# Patient Record
Sex: Female | Born: 1950
Health system: Southern US, Community
[De-identification: ages and names within clinical notes are randomized; demographics above are authoritative.]

## PROBLEM LIST (undated history)

## (undated) DIAGNOSIS — F419 Anxiety disorder, unspecified: Secondary | ICD-10-CM

## (undated) DIAGNOSIS — Z9221 Personal history of antineoplastic chemotherapy: Secondary | ICD-10-CM

## (undated) DIAGNOSIS — Z923 Personal history of irradiation: Secondary | ICD-10-CM

## (undated) DIAGNOSIS — G20A1 Parkinson's disease without dyskinesia, without mention of fluctuations: Secondary | ICD-10-CM

## (undated) DIAGNOSIS — C50919 Malignant neoplasm of unspecified site of unspecified female breast: Secondary | ICD-10-CM

## (undated) DIAGNOSIS — I10 Essential (primary) hypertension: Secondary | ICD-10-CM

## (undated) DIAGNOSIS — G20 Parkinson's disease: Secondary | ICD-10-CM

## (undated) DIAGNOSIS — E785 Hyperlipidemia, unspecified: Secondary | ICD-10-CM

## (undated) DIAGNOSIS — E559 Vitamin D deficiency, unspecified: Secondary | ICD-10-CM

## (undated) DIAGNOSIS — G4700 Insomnia, unspecified: Secondary | ICD-10-CM

## (undated) HISTORY — DX: Essential (primary) hypertension: I10

## (undated) HISTORY — DX: Malignant neoplasm of unspecified site of unspecified female breast: C50.919

## (undated) HISTORY — DX: Insomnia, unspecified: G47.00

## (undated) HISTORY — DX: Anxiety disorder, unspecified: F41.9

## (undated) HISTORY — DX: Vitamin D deficiency, unspecified: E55.9

## (undated) HISTORY — DX: Hyperlipidemia, unspecified: E78.5

## (undated) HISTORY — DX: Parkinson's disease: G20

## (undated) HISTORY — PX: POLYPECTOMY: SHX149

## (undated) HISTORY — DX: Parkinson's disease without dyskinesia, without mention of fluctuations: G20.A1

---

## 1983-12-27 HISTORY — PX: DILATION AND CURETTAGE OF UTERUS: SHX78

## 1993-12-26 DIAGNOSIS — C50919 Malignant neoplasm of unspecified site of unspecified female breast: Secondary | ICD-10-CM

## 1993-12-26 DIAGNOSIS — Z923 Personal history of irradiation: Secondary | ICD-10-CM

## 1993-12-26 DIAGNOSIS — Z9221 Personal history of antineoplastic chemotherapy: Secondary | ICD-10-CM

## 1993-12-26 HISTORY — DX: Personal history of irradiation: Z92.3

## 1993-12-26 HISTORY — DX: Malignant neoplasm of unspecified site of unspecified female breast: C50.919

## 1993-12-26 HISTORY — PX: BREAST BIOPSY: SHX20

## 1993-12-26 HISTORY — DX: Personal history of antineoplastic chemotherapy: Z92.21

## 1993-12-26 HISTORY — PX: BREAST LUMPECTOMY: SHX2

## 2000-09-07 ENCOUNTER — Encounter: Payer: Self-pay | Admitting: General Surgery

## 2000-09-07 ENCOUNTER — Encounter: Admission: RE | Admit: 2000-09-07 | Discharge: 2000-09-07 | Payer: Self-pay | Admitting: General Surgery

## 2000-12-26 HISTORY — PX: REDUCTION MAMMAPLASTY: SUR839

## 2000-12-26 HISTORY — PX: BREAST RECONSTRUCTION: SHX9

## 2001-02-26 ENCOUNTER — Encounter (INDEPENDENT_AMBULATORY_CARE_PROVIDER_SITE_OTHER): Payer: Self-pay | Admitting: Specialist

## 2001-02-26 ENCOUNTER — Ambulatory Visit (HOSPITAL_BASED_OUTPATIENT_CLINIC_OR_DEPARTMENT_OTHER): Admission: RE | Admit: 2001-02-26 | Discharge: 2001-02-27 | Payer: Self-pay | Admitting: Specialist

## 2001-09-20 ENCOUNTER — Encounter: Payer: Self-pay | Admitting: General Surgery

## 2001-09-20 ENCOUNTER — Encounter: Admission: RE | Admit: 2001-09-20 | Discharge: 2001-09-20 | Payer: Self-pay | Admitting: General Surgery

## 2002-02-21 ENCOUNTER — Encounter: Admission: RE | Admit: 2002-02-21 | Discharge: 2002-02-21 | Payer: Self-pay | Admitting: General Surgery

## 2002-02-21 ENCOUNTER — Encounter: Payer: Self-pay | Admitting: General Surgery

## 2002-09-13 ENCOUNTER — Encounter: Payer: Self-pay | Admitting: General Surgery

## 2002-09-13 ENCOUNTER — Encounter: Admission: RE | Admit: 2002-09-13 | Discharge: 2002-09-13 | Payer: Self-pay | Admitting: General Surgery

## 2003-09-12 ENCOUNTER — Encounter: Admission: RE | Admit: 2003-09-12 | Discharge: 2003-09-12 | Payer: Self-pay | Admitting: General Surgery

## 2003-09-12 ENCOUNTER — Encounter: Payer: Self-pay | Admitting: General Surgery

## 2004-09-09 ENCOUNTER — Encounter: Admission: RE | Admit: 2004-09-09 | Discharge: 2004-09-09 | Payer: Self-pay | Admitting: General Surgery

## 2005-09-23 ENCOUNTER — Encounter: Admission: RE | Admit: 2005-09-23 | Discharge: 2005-09-23 | Payer: Self-pay | Admitting: General Surgery

## 2006-07-27 ENCOUNTER — Ambulatory Visit: Payer: Self-pay | Admitting: Gastroenterology

## 2006-08-10 ENCOUNTER — Ambulatory Visit: Payer: Self-pay | Admitting: Gastroenterology

## 2006-10-13 ENCOUNTER — Encounter: Admission: RE | Admit: 2006-10-13 | Discharge: 2006-10-13 | Payer: Self-pay | Admitting: General Surgery

## 2007-10-25 ENCOUNTER — Encounter: Admission: RE | Admit: 2007-10-25 | Discharge: 2007-10-25 | Payer: Self-pay | Admitting: Obstetrics and Gynecology

## 2010-02-26 ENCOUNTER — Encounter: Admission: RE | Admit: 2010-02-26 | Discharge: 2010-02-26 | Payer: Self-pay | Admitting: Obstetrics and Gynecology

## 2011-05-13 NOTE — Op Note (Signed)
[place]  Patient:    Christine Hayes, Christine Hayes                     MRN: [id number] Proc. Date: 02/26/01 Adm. Date:  [id number] Attending:  Gustavus Messing CC:         Yaakov Guthrie. Shon Hough, M.D. (2)   Operative Report  INDICATIONS:  This is a 60 year old lady who has severe macromastia on the right side.  She is status post a lumpectomy and radiation on the left side for breast cancer.  She has increased ptosis on that side.  PROCEDURE: 1. Right breast reduction. 2. Left mastopexy.  SURGEON:  Yaakov Guthrie. Shon Hough, M.D.  ASSISTANT:  Margaretha Sheffield, R.N.  ANESTHESIA:  General.  Preoperatively the patient is set up and drawn for the midline of the abdomen and chest, as well as the drawing from the suprasternal notch to each nipple/areolar complex, remarking each to 20.0 cm.  The mastopexy design was also drawn with the patient sitting up in a crescent diamond design.  The inferior pedicle technique reduction mammoplasty was also drawn out, remarking the nipple/areolar complex to 20.0 cm.  DESCRIPTION OF PROCEDURE:  She then underwent general anesthesia and was intubated orally.  A prep was done to the chest and breast areas in a routine fashion using Betadine soap and solution, and walled off with sterile towels and drapes, so as to make a sterile field.  Xylocaine 0.25% with epinephrine was injected locally for vasoconstriction, a total of 200 cc.  The wound was first scored with the #15 blade.  The left side was done first.  The skin was de-epithelialized over the crescent diamond configuration.  The lateral flaps were then freed up from the midline area, out approximately 10.0 cm.  After proper hemostasis, there was some increased bulging of the left lateral portion of the breast, and a limited reduction was done in that area of approximately 100.0 g.  After proper hemostasis, the flaps were then transposed and stayed with #2-0 Monocryl x 2  layers and then subcutaneous plain, and then running subcuticular stitches of #3-0 Monocryl throughout the mastopexy area.  The wounds were drained with a #10 Blake drain which was placed in the depths of the wound through the lower part of the incision edge. This was secured with #3-0 Prolene.  Next, attention was drawn to the right breast. The wounds were scored with a #15 blade, and the skin over the pedicle was de-epithelialized with a #20 blade.  The medial and lateral fatty dermal pedicles were excised down to underlying fascia.  Hemostasis was maintained with the Bovie unit on coagulation.  Out laterally more breast tissue was taken, as well as the new key hole area.  None was taken from the inferior pedicle itself.  After proper hemostasis, the flaps were transposed and stayed with #3-0 Prolene.  A subcutaneous closure was done with #3-0 Monocryl and #2-0 Monocryl x 2 layers, and then a running subcuticular stitch of #3-0 Monocryl and #5-0 Monocryl throughout the inverted T.  The wounds were drained with #10 Blake drain, which was placed in the depths of the wound, and brought out through a lateral portion of the incision, and secured with #3-0 Prolene.  Steri-Strips and a soft dressing were applied to all the areas.  She withstood the procedures very well, and was taken to the recovery room in excellent condition.DD:  02/26/01 TD:  02/26/01 Job: [id number]

## 2011-09-02 ENCOUNTER — Encounter: Payer: Self-pay | Admitting: Internal Medicine

## 2012-05-04 ENCOUNTER — Other Ambulatory Visit: Payer: Self-pay | Admitting: Obstetrics and Gynecology

## 2012-05-04 DIAGNOSIS — Z1231 Encounter for screening mammogram for malignant neoplasm of breast: Secondary | ICD-10-CM

## 2012-05-10 ENCOUNTER — Ambulatory Visit (AMBULATORY_SURGERY_CENTER): Payer: BC Managed Care – PPO | Admitting: *Deleted

## 2012-05-10 ENCOUNTER — Encounter: Payer: Self-pay | Admitting: Internal Medicine

## 2012-05-10 VITALS — Ht 71.0 in | Wt 210.1 lb

## 2012-05-10 DIAGNOSIS — Z1211 Encounter for screening for malignant neoplasm of colon: Secondary | ICD-10-CM

## 2012-05-10 MED ORDER — PEG-KCL-NACL-NASULF-NA ASC-C 100 G PO SOLR: ORAL | Status: DC

## 2012-05-18 ENCOUNTER — Telehealth: Payer: Self-pay | Admitting: Internal Medicine

## 2012-05-18 NOTE — Telephone Encounter (Signed)
Pt states she had a hard stool and noticed some blood on the toilet paper and on the stool. Has colon scheduled for Thursday and wanted to see if she could use some OTC hemorrhoid medication. Let pt know she could use preparation h otc or tucks wipes as needed. Pt verbalized understanding.

## 2012-05-24 ENCOUNTER — Encounter: Payer: Self-pay | Admitting: Internal Medicine

## 2012-05-24 ENCOUNTER — Ambulatory Visit (AMBULATORY_SURGERY_CENTER): Payer: BC Managed Care – PPO | Admitting: Internal Medicine

## 2012-05-24 VITALS — BP 134/80 | HR 97 | Temp 96.5°F | Resp 18 | Ht 71.0 in | Wt 210.0 lb

## 2012-05-24 DIAGNOSIS — D126 Benign neoplasm of colon, unspecified: Secondary | ICD-10-CM

## 2012-05-24 DIAGNOSIS — Z1211 Encounter for screening for malignant neoplasm of colon: Secondary | ICD-10-CM

## 2012-05-24 DIAGNOSIS — Z86010 Personal history of colonic polyps: Secondary | ICD-10-CM

## 2012-05-24 MED ORDER — SODIUM CHLORIDE 0.9 % IV SOLN: 500.0000 mL | INTRAVENOUS | Status: DC

## 2012-05-24 NOTE — Patient Instructions (Signed)
YOU HAD AN ENDOSCOPIC PROCEDURE TODAY AT THE [facility]: Refer to the procedure report that was given to you for any specific questions about what was found during the examination.  If the procedure report does not answer your questions, please call your gastroenterologist to clarify.  If you requested that your care partner not be given the details of your procedure findings, then the procedure report has been included in a sealed envelope for you to review at your convenience later.  YOU SHOULD EXPECT: Some feelings of bloating in the abdomen. Passage of more gas than usual.  Walking can help get rid of the air that was put into your GI tract during the procedure and reduce the bloating. If you had a lower endoscopy (such as a colonoscopy or flexible sigmoidoscopy) you may notice spotting of blood in your stool or on the toilet paper. If you underwent a bowel prep for your procedure, then you may not have a normal bowel movement for a few days.  DIET: Your first meal following the procedure should be a light meal and then it is ok to progress to your normal diet.  A half-sandwich or bowl of soup is an example of a good first meal.  Heavy or fried foods are harder to digest and may make you feel nauseous or bloated.  Likewise meals heavy in dairy and vegetables can cause extra gas to form and this can also increase the bloating.  Drink plenty of fluids but you should avoid alcoholic beverages for 24 hours.  ACTIVITY: Your care partner should take you home directly after the procedure.  You should plan to take it easy, moving slowly for the rest of the day.  You can resume normal activity the day after the procedure however you should NOT DRIVE or use heavy machinery for 24 hours (because of the sedation medicines used during the test).    SYMPTOMS TO REPORT IMMEDIATELY: A gastroenterologist can be reached at any hour.  During normal business hours, 8:30 AM to 5:00 PM Monday through Friday,  call [phone].  After hours and on weekends, please call the GI answering service at [phone] who will take a message and have the physician on call contact you.   Following lower endoscopy (colonoscopy or flexible sigmoidoscopy):  Excessive amounts of blood in the stool  Significant tenderness or worsening of abdominal pains  Swelling of the abdomen that is new, acute  Fever of 100F or higher   FOLLOW UP: If any biopsies were taken you will be contacted by phone or by letter within the next 1-3 weeks.  Call your gastroenterologist if you have not heard about the biopsies in 3 weeks.  Our staff will call the home number listed on your records the next business day following your procedure to check on you and address any questions or concerns that you may have at that time regarding the information given to you following your procedure. This is a courtesy call and so if there is no answer at the home number and we have not heard from you through the emergency physician on call, we will assume that you have returned to your regular daily activities without incident.  SIGNATURES/CONFIDENTIALITY: You and/or your care partner have signed paperwork which will be entered into your electronic medical record.  These signatures attest to the fact that that the information above on your After Visit Summary has been reviewed and is understood.  Full responsibility of the confidentiality of   this discharge information lies with you and/or your care-partner.   INFORMATION ON POLYPS GIVEN TO YOU TODAY 

## 2012-05-24 NOTE — Progress Notes (Signed)
Patient did not experience any of the following events: a burn prior to discharge; a fall within the facility; wrong site/side/patient/procedure/implant event; or a hospital transfer or hospital admission upon discharge from the facility. (G8907) Patient did not have preoperative order for IV antibiotic SSI prophylaxis. (G8918)  

## 2012-05-24 NOTE — Op Note (Signed)
[facility] [address] [place], [place]  [address]  COLONOSCOPY PROCEDURE REPORT  PATIENT:  Christine Hayes, Christine Hayes  MR#:  [id number] BIRTHDATE:  [birth year], 61 yrs. old  GENDER:  female ENDOSCOPIST:  Wilhemina Bonito. Eda Keys, MD REF. BY:  Surveillance Program Recall, PROCEDURE DATE:  05/24/2012 PROCEDURE:  Colonoscopy with snare polypectomy x 1 ASA CLASS:  Class II INDICATIONS:  history of pre-cancerous (adenomatous) colon polyps, surveillance and high-risk screening ; index 07-2004 w/ TAs; f/u 2007 MEDICATIONS:   MAC sedation, administered by CRNA, propofol (Diprivan) 380 mg IV  DESCRIPTION OF PROCEDURE:   After the risks benefits and alternatives of the procedure were thoroughly explained, informed consent was obtained.  Digital rectal exam was performed and revealed no abnormalities.   The LB CF-[id number] endoscope was introduced through the anus and advanced to the cecum, which was identified by both the appendix and ileocecal valve, without limitations.  The quality of the prep was excellent, using MoviPrep.  The instrument was then slowly withdrawn as the colon was fully examined. <<PROCEDUREIMAGES>>  FINDINGS:  A 7mm polyp was found in the sigmoid colon and snared without cautery. Retrieval was successful. Otherwise normal colonoscopy without other polyps, masses, vascular ectasias, or inflammatory changes.   Retroflexed views in the rectum revealed no abnormalities. The time to cecum =  5:29  minutes. The scope was then withdrawn in 13:33  minutes from the cecum and the procedure completed. COMPLICATIONS:  None  ENDOSCOPIC IMPRESSION: 1) Diminutive polyp in the sigmoid colon- removed 2) Otherwise normal colonoscopy  RECOMMENDATIONS: 1) Follow up colonoscopy in 5 years  ______________________________ Wilhemina Bonito. Eda Keys, MD  CC:  Wyn Forster MD [place], [place]);  The Patient  n. eSIGNED:   Salil Raineri N. Eda Keys at 05/24/2012 09:27 AM  Annye Rusk,  [phone]

## 2012-05-24 NOTE — Progress Notes (Addendum)
See scanned crna intra procedure report- propofol per [facility] crna. ewm  Pt tolerated procedure well. ewm

## 2012-05-25 ENCOUNTER — Telehealth: Payer: Self-pay | Admitting: *Deleted

## 2012-05-25 NOTE — Telephone Encounter (Signed)
  Follow up Call-  Call back number 05/24/2012  Post procedure Call Back phone  # [phone]  Permission to leave phone message Yes     Patient questions: No machine, unable to leave a message.  No answer

## 2012-05-30 ENCOUNTER — Encounter: Payer: Self-pay | Admitting: Internal Medicine

## 2012-05-31 ENCOUNTER — Ambulatory Visit
Admission: RE | Admit: 2012-05-31 | Discharge: 2012-05-31 | Disposition: A | Discharge status: Home / Self Care | Payer: BC Managed Care – PPO | Source: Ambulatory Visit | Attending: Obstetrics and Gynecology | Admitting: Obstetrics and Gynecology

## 2012-05-31 DIAGNOSIS — Z1231 Encounter for screening mammogram for malignant neoplasm of breast: Secondary | ICD-10-CM

## 2012-06-05 ENCOUNTER — Other Ambulatory Visit: Payer: Self-pay | Admitting: Obstetrics and Gynecology

## 2012-06-05 DIAGNOSIS — R928 Other abnormal and inconclusive findings on diagnostic imaging of breast: Secondary | ICD-10-CM

## 2012-06-07 ENCOUNTER — Ambulatory Visit
Admission: RE | Admit: 2012-06-07 | Discharge: 2012-06-07 | Disposition: A | Discharge status: Home / Self Care | Payer: BC Managed Care – PPO | Source: Ambulatory Visit | Attending: Obstetrics and Gynecology | Admitting: Obstetrics and Gynecology

## 2012-06-07 DIAGNOSIS — R928 Other abnormal and inconclusive findings on diagnostic imaging of breast: Secondary | ICD-10-CM

## 2012-11-23 ENCOUNTER — Other Ambulatory Visit: Payer: Self-pay | Admitting: Obstetrics and Gynecology

## 2012-11-23 DIAGNOSIS — N63 Unspecified lump in unspecified breast: Secondary | ICD-10-CM

## 2012-12-06 ENCOUNTER — Ambulatory Visit
Admission: RE | Admit: 2012-12-06 | Discharge: 2012-12-06 | Disposition: A | Discharge status: Home / Self Care | Payer: BC Managed Care – PPO | Source: Ambulatory Visit | Attending: Obstetrics and Gynecology | Admitting: Obstetrics and Gynecology

## 2012-12-06 DIAGNOSIS — N63 Unspecified lump in unspecified breast: Secondary | ICD-10-CM

## 2014-02-06 ENCOUNTER — Other Ambulatory Visit: Payer: Self-pay

## 2014-02-06 DIAGNOSIS — Z1231 Encounter for screening mammogram for malignant neoplasm of breast: Secondary | ICD-10-CM

## 2014-02-27 ENCOUNTER — Ambulatory Visit
Admission: RE | Admit: 2014-02-27 | Discharge: 2014-02-27 | Disposition: A | Discharge status: Home / Self Care | Payer: Self-pay | Source: Ambulatory Visit

## 2014-02-27 DIAGNOSIS — Z1231 Encounter for screening mammogram for malignant neoplasm of breast: Secondary | ICD-10-CM

## 2016-02-25 ENCOUNTER — Other Ambulatory Visit: Payer: Self-pay

## 2016-02-25 DIAGNOSIS — Z1231 Encounter for screening mammogram for malignant neoplasm of breast: Secondary | ICD-10-CM

## 2016-03-09 ENCOUNTER — Ambulatory Visit: Payer: Self-pay

## 2016-03-16 ENCOUNTER — Ambulatory Visit
Admission: RE | Admit: 2016-03-16 | Discharge: 2016-03-16 | Disposition: A | Discharge status: Home / Self Care | Payer: BLUE CROSS/BLUE SHIELD | Source: Ambulatory Visit

## 2016-03-16 DIAGNOSIS — Z1231 Encounter for screening mammogram for malignant neoplasm of breast: Secondary | ICD-10-CM

## 2017-03-22 ENCOUNTER — Encounter: Payer: Self-pay | Admitting: Internal Medicine

## 2017-04-20 ENCOUNTER — Other Ambulatory Visit: Payer: Self-pay | Admitting: Obstetrics and Gynecology

## 2017-04-20 DIAGNOSIS — Z1231 Encounter for screening mammogram for malignant neoplasm of breast: Secondary | ICD-10-CM

## 2017-04-26 ENCOUNTER — Encounter: Payer: Self-pay | Admitting: Internal Medicine

## 2017-05-15 ENCOUNTER — Other Ambulatory Visit: Payer: Self-pay | Admitting: Family Medicine

## 2017-05-15 ENCOUNTER — Ambulatory Visit
Admission: RE | Admit: 2017-05-15 | Discharge: 2017-05-15 | Disposition: A | Discharge status: Home / Self Care | Payer: BLUE CROSS/BLUE SHIELD | Source: Ambulatory Visit | Attending: Obstetrics and Gynecology | Admitting: Obstetrics and Gynecology

## 2017-05-15 DIAGNOSIS — Z1231 Encounter for screening mammogram for malignant neoplasm of breast: Secondary | ICD-10-CM

## 2017-05-15 HISTORY — DX: Personal history of antineoplastic chemotherapy: Z92.21

## 2017-05-15 HISTORY — DX: Personal history of irradiation: Z92.3

## 2017-06-26 ENCOUNTER — Ambulatory Visit (AMBULATORY_SURGERY_CENTER): Payer: Self-pay | Admitting: *Deleted

## 2017-06-26 VITALS — Ht 71.0 in | Wt 218.0 lb

## 2017-06-26 DIAGNOSIS — Z86010 Personal history of colonic polyps: Secondary | ICD-10-CM

## 2017-06-26 DIAGNOSIS — N649 Disorder of breast, unspecified: Secondary | ICD-10-CM | POA: Insufficient documentation

## 2017-06-26 DIAGNOSIS — F419 Anxiety disorder, unspecified: Secondary | ICD-10-CM | POA: Insufficient documentation

## 2017-06-26 DIAGNOSIS — C801 Malignant (primary) neoplasm, unspecified: Secondary | ICD-10-CM | POA: Insufficient documentation

## 2017-06-26 DIAGNOSIS — IMO0002 Reserved for concepts with insufficient information to code with codable children: Secondary | ICD-10-CM | POA: Insufficient documentation

## 2017-06-26 DIAGNOSIS — E756 Lipid storage disorder, unspecified: Secondary | ICD-10-CM | POA: Insufficient documentation

## 2017-06-26 MED ORDER — NA SULFATE-K SULFATE-MG SULF 17.5-3.13-1.6 GM/177ML PO SOLN: ORAL | 0 refills | Status: DC

## 2017-06-26 NOTE — Progress Notes (Signed)
Patient denies any allergies to eggs or soy. Patient denies any problems with anesthesia/sedation. Patient denies any oxygen use at home. Patient takes a diet/weight loss patch, she is aware to stop this 10 days prior to colonoscopy. EMMI education assisgned to patient on colonoscopy, this was explained and instructions given to patient.

## 2017-07-12 ENCOUNTER — Encounter: Payer: Self-pay | Admitting: Internal Medicine

## 2017-07-12 ENCOUNTER — Ambulatory Visit (AMBULATORY_SURGERY_CENTER): Payer: BLUE CROSS/BLUE SHIELD | Admitting: Internal Medicine

## 2017-07-12 VITALS — BP 158/84 | HR 71 | Temp 97.7°F | Resp 23 | Ht 71.0 in | Wt 218.0 lb

## 2017-07-12 DIAGNOSIS — D125 Benign neoplasm of sigmoid colon: Secondary | ICD-10-CM

## 2017-07-12 DIAGNOSIS — D123 Benign neoplasm of transverse colon: Secondary | ICD-10-CM

## 2017-07-12 DIAGNOSIS — D122 Benign neoplasm of ascending colon: Secondary | ICD-10-CM | POA: Diagnosis not present

## 2017-07-12 DIAGNOSIS — Z86010 Personal history of colonic polyps: Secondary | ICD-10-CM | POA: Diagnosis not present

## 2017-07-12 HISTORY — PX: COLONOSCOPY: SHX174

## 2017-07-12 MED ORDER — SODIUM CHLORIDE 0.9 % IV SOLN: 500.0000 mL | INTRAVENOUS | Status: AC

## 2017-07-12 NOTE — Progress Notes (Signed)
Called to room to assist during endoscopic procedure.  Patient ID and intended procedure confirmed with present staff. Received instructions for my participation in the procedure from the performing physician.  

## 2017-07-12 NOTE — Progress Notes (Signed)
Spontaneous respirations throughout. VSS. Resting comfortably. To PACU on room air. Report to  Annette RN.  

## 2017-07-12 NOTE — Op Note (Signed)
[facility] Patient Name: Christine Hayes Procedure Date: 07/12/2017 10:30 AM MRN: [id number] Endoscopist: Docia Chuck. Henrene Pastor , MD Age: 66 Referring MD:  Date of Birth: Jun 01, 1951 Gender: Female Account #: [id number] Procedure:                Colonoscopy with cold snare polypectomy x 5 Indications:              High risk colon cancer surveillance: Personal                            history of multiple (3 or more) adenomas Medicines:                Monitored Anesthesia Care Procedure:                Pre-Anesthesia Assessment:                           - Prior to the procedure, a History and Physical                            was performed, and patient medications and                            allergies were reviewed. The patient's tolerance of                            previous anesthesia was also reviewed. The risks                            and benefits of the procedure and the sedation                            options and risks were discussed with the patient.                            All questions were answered, and informed consent                            was obtained. Prior Anticoagulants: The patient has                            taken no previous anticoagulant or antiplatelet                            agents. ASA Grade Assessment: II - A patient with                            mild systemic disease. After reviewing the risks                            and benefits, the patient was deemed in                            satisfactory condition to undergo the procedure.  After obtaining informed consent, the colonoscope                            was passed under direct vision. Throughout the                            procedure, the patient's blood pressure, pulse, and                            oxygen saturations were monitored continuously. The                            Colonoscope was introduced through the anus and          advanced to the the cecum, identified by                            appendiceal orifice and ileocecal valve. The                            ileocecal valve, appendiceal orifice, and rectum                            were photographed. The quality of the bowel                            preparation was excellent. The colonoscopy was                            performed without difficulty. The patient tolerated                            the procedure well. The bowel preparation used was                            SUPREP. Scope In: 10:41:21 AM Scope Out: 11:02:26 AM Scope Withdrawal Time: 0 hours 16 minutes 46 seconds  Total Procedure Duration: 0 hours 21 minutes 5 seconds  Findings:                 Five polyps were found in the sigmoid colon,                            transverse colon, hepatic flexure and ascending                            colon. The polyps were 2 to 4 mm in size. These                            polyps were removed with a cold snare. Resection                            and retrieval were complete.  Internal hemorrhoids were found during retroflexion.                           The exam was otherwise without abnormality on                            direct and retroflexion views. Complications:            No immediate complications. Estimated blood loss:                            None. Estimated Blood Loss:     Estimated blood loss: none. Impression:               - Five 2 to 4 mm polyps in the sigmoid colon, in                            the transverse colon, at the hepatic flexure and in                            the ascending colon, removed with a cold snare.                            Resected and retrieved.                           - Internal hemorrhoids.                           - The examination was otherwise normal on direct                            and retroflexion views. Recommendation:           - Repeat colonoscopy in  3 - 5 years for                            surveillance.                           - Patient has a contact number available for                            emergencies. The signs and symptoms of potential                            delayed complications were discussed with the                            patient. Return to normal activities tomorrow.                            Written discharge instructions were provided to the                            patient.                           -  Resume previous diet.                           - Continue present medications.                           - Await pathology results. Docia Chuck. Henrene Pastor, MD 07/12/2017 11:11:01 AM This report has been signed electronically.

## 2017-07-12 NOTE — Progress Notes (Signed)
No problems noted in the recovery room. maw 

## 2017-07-12 NOTE — Patient Instructions (Signed)
YOU HAD AN ENDOSCOPIC PROCEDURE TODAY AT THE [facility]:   Refer to the procedure report that was given to you for any specific questions about what was found during the examination.  If the procedure report does not answer your questions, please call your gastroenterologist to clarify.  If you requested that your care partner not be given the details of your procedure findings, then the procedure report has been included in a sealed envelope for you to review at your convenience later.  YOU SHOULD EXPECT: Some feelings of bloating in the abdomen. Passage of more gas than usual.  Walking can help get rid of the air that was put into your GI tract during the procedure and reduce the bloating. If you had a lower endoscopy (such as a colonoscopy or flexible sigmoidoscopy) you may notice spotting of blood in your stool or on the toilet paper. If you underwent a bowel prep for your procedure, you may not have a normal bowel movement for a few days.  Please Note:  You might notice some irritation and congestion in your nose or some drainage.  This is from the oxygen used during your procedure.  There is no need for concern and it should clear up in a day or so.  SYMPTOMS TO REPORT IMMEDIATELY:   Following lower endoscopy (colonoscopy or flexible sigmoidoscopy):  Excessive amounts of blood in the stool  Significant tenderness or worsening of abdominal pains  Swelling of the abdomen that is new, acute  Fever of 100F or higher   For urgent or emergent issues, a gastroenterologist can be reached at any hour by calling [phone].   DIET:  We do recommend a small meal at first, but then you may proceed to your regular diet.  Drink plenty of fluids but you should avoid alcoholic beverages for 24 hours.  ACTIVITY:  You should plan to take it easy for the rest of today and you should NOT DRIVE or use heavy machinery until tomorrow (because of the sedation medicines used during the test).     FOLLOW UP: Our staff will call the number listed on your records the next business day following your procedure to check on you and address any questions or concerns that you may have regarding the information given to you following your procedure. If we do not reach you, we will leave a message.  However, if you are feeling well and you are not experiencing any problems, there is no need to return our call.  We will assume that you have returned to your regular daily activities without incident.  If any biopsies were taken you will be contacted by phone or by letter within the next 1-3 weeks.  Please call us at [phone] if you have not heard about the biopsies in 3 weeks.    SIGNATURES/CONFIDENTIALITY: You and/or your care partner have signed paperwork which will be entered into your electronic medical record.  These signatures attest to the fact that that the information above on your After Visit Summary has been reviewed and is understood.  Full responsibility of the confidentiality of this discharge information lies with you and/or your care-partner.    Handouts were given to your care partner on polyps and hemorrhoids. You may resume your current medications today. Await biopsy results. Please call if any questions or concerns.   

## 2017-07-13 ENCOUNTER — Telehealth: Payer: Self-pay

## 2017-07-13 NOTE — Telephone Encounter (Signed)
  Follow up Call-  Call back number 07/12/2017  Post procedure Call Back phone  # [phone]  Permission to leave phone message Yes  Some recent data might be hidden     Patient questions:  Do you have a fever, pain , or abdominal swelling? No. Pain Score  0 *  Have you tolerated food without any problems? Yes.    Have you been able to return to your normal activities? Yes.    Do you have any questions about your discharge instructions: Diet   No. Medications  No. Follow up visit  No.  Do you have questions or concerns about your Care? No.  Actions: * If pain score is 4 or above: No action needed, pain <4.  No problems noted per pt. maw

## 2017-07-18 ENCOUNTER — Encounter: Payer: Self-pay | Admitting: Internal Medicine

## 2017-07-19 ENCOUNTER — Encounter: Payer: Self-pay | Admitting: *Deleted

## 2017-08-21 ENCOUNTER — Encounter (INDEPENDENT_AMBULATORY_CARE_PROVIDER_SITE_OTHER): Payer: Self-pay

## 2017-08-21 ENCOUNTER — Encounter: Payer: Self-pay | Admitting: Podiatry

## 2017-08-21 ENCOUNTER — Ambulatory Visit (INDEPENDENT_AMBULATORY_CARE_PROVIDER_SITE_OTHER): Payer: BLUE CROSS/BLUE SHIELD | Admitting: Podiatry

## 2017-08-21 VITALS — BP 141/80 | HR 79

## 2017-08-21 DIAGNOSIS — M79675 Pain in left toe(s): Secondary | ICD-10-CM | POA: Diagnosis not present

## 2017-08-21 DIAGNOSIS — L601 Onycholysis: Secondary | ICD-10-CM | POA: Diagnosis not present

## 2017-08-21 NOTE — Patient Instructions (Signed)

## 2017-08-21 NOTE — Progress Notes (Signed)
   Subjective:    Patient ID: Christine Hayes, female    DOB: September 09, 1951, 66 y.o.   MRN: [id number]  HPI  I noticed some pressure with my shoes on my left great toe, I get regular pedicures.  Than last night it was really bothering me and I had my husband look and he thought the nail was coming off so I called today.     Review of Systems  All other systems reviewed and are negative.      Objective:   Physical Exam        Assessment & Plan:

## 2017-08-21 NOTE — Progress Notes (Signed)
  Subjective:  Patient ID: Christine Hayes, female    DOB: 1951/11/03,  MRN: [id number] HPI Chief Complaint  Patient presents with  . Nail Problem    left big toe nail is broken and lifting    67 y.o. female presents with the above complaint. Endorses frequent pedicures about every 3 weeks. Reports she went hiking this weekend and felt pain in her toe but thought it was just from her shoes. States when she took her sock off she noticed the nail appeared "broken." Reports pain in the toe. Denies other pedal issues. Has never had this issue before.  Past Medical History:  Diagnosis Date  . Anxiety   . Breast cancer ([place]) 1995  . Hyperlipidemia   . Hypertension   . Personal history of chemotherapy 1995  . Personal history of radiation therapy 1995   Past Surgical History:  Procedure Laterality Date  . BREAST BIOPSY Left 1995   malignant  . BREAST LUMPECTOMY Left 1995   left  . BREAST RECONSTRUCTION  2002   left and breast reduction right breast  . DILATION AND CURETTAGE OF UTERUS  1985  . REDUCTION MAMMAPLASTY Bilateral 2002    Current Outpatient Prescriptions:  .  aspirin EC 81 MG tablet, Take 81 mg by mouth daily., Disp: , Rfl:  .  LORazepam (ATIVAN) 1 MG tablet, Take 1 mg by mouth at bedtime. , Disp: , Rfl:  .  losartan (COZAAR) 50 MG tablet, Take 50 mg by mouth daily., Disp: , Rfl:  .  PRESCRIPTION MEDICATION, 1 patch daily. Weight loss control patch (fat burning & appetite suppressing), Disp: , Rfl:  .  simvastatin (ZOCOR) 40 MG tablet, Take 40 mg by mouth daily., Disp: , Rfl:   Current Facility-Administered Medications:  .  0.9 %  sodium chloride infusion, 500 mL, Intravenous, Continuous, Irene Shipper, MD  No Known Allergies Review of Systems Objective:   Vitals:   08/21/17 0849  BP: (!) 141/80  Pulse: 79   General AA&O x3. Normal mood and affect.  Vascular Dorsalis pedis and posterior tibial pulses  present 2+ bilaterally  Capillary refill normal to all  digits. Pedal hair growth normal.  Neurologic Epicritic sensation present bilaterally.  Dermatologic No open lesions. Interspaces clear of maceration.  Normal skin temperature and turgor. L hallux nail with >50% lysis distally. Nail yellowish in appearance. L 4th toe nevus with regular borders and color.  Orthopedic: MMT 5/5 in dorsiflexion, plantarflexion, inversion, and eversion. Normal lower extremity joint ROM without pain or crepitus.    Assessment & Plan:  Patient was evaluated and treated and all questions answered.  Nail Lysis, L Hallux -Patient elects to proceed with ingrown toenail removal today. -Total nail avulsion L hallux. See procedure note. -Educated on post-procedure care including soaking. Written instructions provided. -Patient to follow up in 2 weeks for nail check.  Procedure: Excision of Ingrown Toenail Location: Left 1st toe  Anesthesia: Lidocaine 1% plain; 1.53mL and Marcaine 0.5% plain; 1.67mL, digital block. Skin Prep: Betadine. Dressing: antibiotic ointment; telfa; dry, sterile, compression dressing. Technique: Following skin prep, the toe was exsanguinated and a tourniquet was secured at the base of the toe. The nail was freed and avulsed with a freer and hemostat. The tourniquet was then removed, good capillary refill was observed, and sterile dressing applied. Disposition: Patient tolerated procedure well. Patient to return in 2 weeks for follow-up.  Return in about 2 weeks (around 09/04/2017).

## 2017-09-04 ENCOUNTER — Ambulatory Visit: Payer: BLUE CROSS/BLUE SHIELD | Admitting: Podiatry

## 2017-09-11 ENCOUNTER — Ambulatory Visit: Payer: BLUE CROSS/BLUE SHIELD | Admitting: Podiatry

## 2018-02-26 DIAGNOSIS — L821 Other seborrheic keratosis: Secondary | ICD-10-CM | POA: Diagnosis not present

## 2018-03-07 DIAGNOSIS — Z23 Encounter for immunization: Secondary | ICD-10-CM | POA: Diagnosis not present

## 2018-08-13 ENCOUNTER — Other Ambulatory Visit: Payer: Self-pay | Admitting: Family Medicine

## 2018-08-13 DIAGNOSIS — Z1231 Encounter for screening mammogram for malignant neoplasm of breast: Secondary | ICD-10-CM

## 2018-08-22 DIAGNOSIS — E785 Hyperlipidemia, unspecified: Secondary | ICD-10-CM | POA: Diagnosis not present

## 2018-08-22 DIAGNOSIS — E559 Vitamin D deficiency, unspecified: Secondary | ICD-10-CM | POA: Diagnosis not present

## 2018-08-22 DIAGNOSIS — Z1331 Encounter for screening for depression: Secondary | ICD-10-CM | POA: Diagnosis not present

## 2018-08-22 DIAGNOSIS — I10 Essential (primary) hypertension: Secondary | ICD-10-CM | POA: Diagnosis not present

## 2018-08-22 DIAGNOSIS — Z0000 Encounter for general adult medical examination without abnormal findings: Secondary | ICD-10-CM | POA: Diagnosis not present

## 2018-08-22 DIAGNOSIS — E669 Obesity, unspecified: Secondary | ICD-10-CM | POA: Diagnosis not present

## 2018-08-22 DIAGNOSIS — Z79899 Other long term (current) drug therapy: Secondary | ICD-10-CM | POA: Diagnosis not present

## 2018-08-22 DIAGNOSIS — Z9181 History of falling: Secondary | ICD-10-CM | POA: Diagnosis not present

## 2018-08-22 DIAGNOSIS — R739 Hyperglycemia, unspecified: Secondary | ICD-10-CM | POA: Diagnosis not present

## 2018-09-05 ENCOUNTER — Ambulatory Visit
Admission: RE | Admit: 2018-09-05 | Discharge: 2018-09-05 | Disposition: A | Discharge status: Home / Self Care | Payer: Medicare Other | Source: Ambulatory Visit | Attending: Family Medicine | Admitting: Family Medicine

## 2018-09-05 DIAGNOSIS — Z1231 Encounter for screening mammogram for malignant neoplasm of breast: Secondary | ICD-10-CM | POA: Diagnosis not present

## 2019-03-11 DIAGNOSIS — Z23 Encounter for immunization: Secondary | ICD-10-CM | POA: Diagnosis not present

## 2019-09-26 DIAGNOSIS — M1712 Unilateral primary osteoarthritis, left knee: Secondary | ICD-10-CM | POA: Diagnosis not present

## 2019-10-08 ENCOUNTER — Ambulatory Visit
Admission: RE | Admit: 2019-10-08 | Discharge: 2019-10-08 | Disposition: A | Discharge status: Home / Self Care | Payer: Medicare Other | Source: Ambulatory Visit | Attending: Family Medicine | Admitting: Family Medicine

## 2019-10-08 ENCOUNTER — Other Ambulatory Visit: Payer: Self-pay | Admitting: Family Medicine

## 2019-10-08 ENCOUNTER — Other Ambulatory Visit: Payer: Self-pay

## 2019-10-08 DIAGNOSIS — Z1231 Encounter for screening mammogram for malignant neoplasm of breast: Secondary | ICD-10-CM

## 2019-11-18 DIAGNOSIS — E785 Hyperlipidemia, unspecified: Secondary | ICD-10-CM | POA: Diagnosis not present

## 2019-11-18 DIAGNOSIS — I10 Essential (primary) hypertension: Secondary | ICD-10-CM | POA: Diagnosis not present

## 2019-11-18 DIAGNOSIS — Z1331 Encounter for screening for depression: Secondary | ICD-10-CM | POA: Diagnosis not present

## 2019-11-18 DIAGNOSIS — Z1231 Encounter for screening mammogram for malignant neoplasm of breast: Secondary | ICD-10-CM | POA: Diagnosis not present

## 2019-11-18 DIAGNOSIS — Z9181 History of falling: Secondary | ICD-10-CM | POA: Diagnosis not present

## 2020-01-04 DIAGNOSIS — M25551 Pain in right hip: Secondary | ICD-10-CM | POA: Diagnosis not present

## 2020-01-10 DIAGNOSIS — M25551 Pain in right hip: Secondary | ICD-10-CM | POA: Diagnosis not present

## 2020-01-10 DIAGNOSIS — M898X5 Other specified disorders of bone, thigh: Secondary | ICD-10-CM | POA: Diagnosis not present

## 2020-01-15 DIAGNOSIS — M25551 Pain in right hip: Secondary | ICD-10-CM | POA: Diagnosis not present

## 2020-02-26 DIAGNOSIS — E559 Vitamin D deficiency, unspecified: Secondary | ICD-10-CM | POA: Diagnosis not present

## 2020-02-26 DIAGNOSIS — E785 Hyperlipidemia, unspecified: Secondary | ICD-10-CM | POA: Diagnosis not present

## 2020-02-26 DIAGNOSIS — R739 Hyperglycemia, unspecified: Secondary | ICD-10-CM | POA: Diagnosis not present

## 2020-04-29 DIAGNOSIS — M25562 Pain in left knee: Secondary | ICD-10-CM | POA: Diagnosis not present

## 2020-04-29 DIAGNOSIS — M1712 Unilateral primary osteoarthritis, left knee: Secondary | ICD-10-CM | POA: Diagnosis not present

## 2020-05-04 ENCOUNTER — Other Ambulatory Visit: Payer: Self-pay

## 2020-05-04 ENCOUNTER — Ambulatory Visit: Payer: BLUE CROSS/BLUE SHIELD | Admitting: Podiatry

## 2020-05-06 DIAGNOSIS — M1712 Unilateral primary osteoarthritis, left knee: Secondary | ICD-10-CM | POA: Diagnosis not present

## 2020-05-06 DIAGNOSIS — M25562 Pain in left knee: Secondary | ICD-10-CM | POA: Diagnosis not present

## 2020-05-06 DIAGNOSIS — M25561 Pain in right knee: Secondary | ICD-10-CM | POA: Diagnosis not present

## 2020-05-11 ENCOUNTER — Ambulatory Visit (INDEPENDENT_AMBULATORY_CARE_PROVIDER_SITE_OTHER): Payer: Medicare Other | Admitting: Podiatry

## 2020-05-11 ENCOUNTER — Other Ambulatory Visit: Payer: Self-pay

## 2020-05-11 ENCOUNTER — Encounter: Payer: Self-pay | Admitting: Podiatry

## 2020-05-11 DIAGNOSIS — M19079 Primary osteoarthritis, unspecified ankle and foot: Secondary | ICD-10-CM | POA: Diagnosis not present

## 2020-05-11 DIAGNOSIS — M779 Enthesopathy, unspecified: Secondary | ICD-10-CM | POA: Diagnosis not present

## 2020-05-11 NOTE — Progress Notes (Signed)
  Subjective:  Patient ID: Christine Hayes, female    DOB: Sep 07, 1951,  MRN: [id number]  Chief Complaint  Patient presents with  . Foot Problem    my left foot was hurting as well as the right but i went and saw orthpedic about my left knee and started taken ibuprofen    69 y.o. female presents with the above complaint. History confirmed with patient.   Objective:  Physical Exam: warm, good capillary refill, no trophic changes or ulcerative lesions, normal DP and PT pulses and normal sensory exam. Bilat Foot: mild POP dorsal foot bilat with prominent midfoot exostosis    Assessment:   1. Capsulitis   2. Arthritis, midfoot     Plan:  Patient was evaluated and treated and all questions answered.  Arthritis -Educated on etiology  -Defer injection today. -Discussed not taking Ibu for extended period.  No follow-ups on file.

## 2020-05-11 NOTE — Addendum Note (Signed)
Addended by: Cranford Mon R on: 05/11/2020 11:17 AM   Modules accepted: Orders

## 2020-05-13 DIAGNOSIS — M1712 Unilateral primary osteoarthritis, left knee: Secondary | ICD-10-CM | POA: Diagnosis not present

## 2020-05-13 DIAGNOSIS — M25562 Pain in left knee: Secondary | ICD-10-CM | POA: Diagnosis not present

## 2020-05-19 ENCOUNTER — Ambulatory Visit (INDEPENDENT_AMBULATORY_CARE_PROVIDER_SITE_OTHER): Payer: Medicare Other | Admitting: Podiatry

## 2020-05-19 DIAGNOSIS — Z5329 Procedure and treatment not carried out because of patient's decision for other reasons: Secondary | ICD-10-CM

## 2020-05-19 NOTE — Progress Notes (Signed)
Same day cxl / no show.

## 2020-05-20 DIAGNOSIS — M1712 Unilateral primary osteoarthritis, left knee: Secondary | ICD-10-CM | POA: Diagnosis not present

## 2020-05-20 DIAGNOSIS — M25562 Pain in left knee: Secondary | ICD-10-CM | POA: Diagnosis not present

## 2020-05-29 DIAGNOSIS — M25562 Pain in left knee: Secondary | ICD-10-CM | POA: Diagnosis not present

## 2020-05-29 DIAGNOSIS — M1712 Unilateral primary osteoarthritis, left knee: Secondary | ICD-10-CM | POA: Diagnosis not present

## 2020-06-15 ENCOUNTER — Other Ambulatory Visit: Payer: Self-pay

## 2020-06-15 ENCOUNTER — Ambulatory Visit (INDEPENDENT_AMBULATORY_CARE_PROVIDER_SITE_OTHER): Payer: Medicare Other | Admitting: Podiatry

## 2020-06-15 DIAGNOSIS — M19071 Primary osteoarthritis, right ankle and foot: Secondary | ICD-10-CM | POA: Diagnosis not present

## 2020-06-15 DIAGNOSIS — M19072 Primary osteoarthritis, left ankle and foot: Secondary | ICD-10-CM

## 2020-06-15 DIAGNOSIS — M19079 Primary osteoarthritis, unspecified ankle and foot: Secondary | ICD-10-CM

## 2020-06-15 MED ORDER — MELOXICAM 7.5 MG PO TABS
7.5000 mg | ORAL_TABLET | Freq: Every day | ORAL | 0 refills | Status: DC
Start: 2020-06-15 — End: 2020-10-19

## 2020-06-15 NOTE — Progress Notes (Signed)
  Subjective:  Patient ID: Christine Hayes, female    DOB: [birth year],  MRN: [id number]  Chief Complaint  Patient presents with  . Foot Pain    F/U for injections Pt.s tates," still having a lot of pain, pain sometimes worse than others; Lt>Rt; 6/10 shapr pains.' - w/ burning -pt denies swellgin tx: IBU     69 y.o. female presents with the above complaint. History confirmed with patient.   Objective:  Physical Exam: warm, good capillary refill, no trophic changes or ulcerative lesions, normal DP and PT pulses and normal sensory exam. Dorsal midfoot exostosis with POP bilat, left more prominent.  Assessment:   1. Arthritis of midfoot    Plan:  Patient was evaluated and treated and all questions answered.  Arthritis -Injection delivered to the painful joint -Rx Mobic  Procedure: Joint Injection Location: Bilateral 2nd TMT joint Skin Prep: Alcohol. Injectate: 0.5 cc 1% lidocaine plain, 0.5 cc dexamethasone phosphate. Disposition: Patient tolerated procedure well. Injection site dressed with a band-aid.   Return in about 1 month (around 07/15/2020) for Arthritis.

## 2020-07-10 DIAGNOSIS — I10 Essential (primary) hypertension: Secondary | ICD-10-CM | POA: Diagnosis not present

## 2020-07-10 DIAGNOSIS — E785 Hyperlipidemia, unspecified: Secondary | ICD-10-CM | POA: Diagnosis not present

## 2020-07-10 DIAGNOSIS — Z6833 Body mass index (BMI) 33.0-33.9, adult: Secondary | ICD-10-CM | POA: Diagnosis not present

## 2020-07-10 DIAGNOSIS — R748 Abnormal levels of other serum enzymes: Secondary | ICD-10-CM | POA: Diagnosis not present

## 2020-07-10 DIAGNOSIS — R739 Hyperglycemia, unspecified: Secondary | ICD-10-CM | POA: Diagnosis not present

## 2020-07-10 DIAGNOSIS — R197 Diarrhea, unspecified: Secondary | ICD-10-CM | POA: Diagnosis not present

## 2020-07-13 DIAGNOSIS — R197 Diarrhea, unspecified: Secondary | ICD-10-CM | POA: Diagnosis not present

## 2020-07-20 ENCOUNTER — Encounter: Payer: Self-pay | Admitting: Internal Medicine

## 2020-07-20 ENCOUNTER — Other Ambulatory Visit: Payer: Self-pay

## 2020-07-20 ENCOUNTER — Ambulatory Visit (INDEPENDENT_AMBULATORY_CARE_PROVIDER_SITE_OTHER): Payer: Medicare Other | Admitting: Podiatry

## 2020-07-20 DIAGNOSIS — M19079 Primary osteoarthritis, unspecified ankle and foot: Secondary | ICD-10-CM

## 2020-07-20 DIAGNOSIS — M19072 Primary osteoarthritis, left ankle and foot: Secondary | ICD-10-CM | POA: Diagnosis not present

## 2020-07-20 NOTE — Progress Notes (Signed)
  Subjective:  Patient ID: Christine Hayes, female    DOB: 1951-03-08,  MRN: [id number]  Chief Complaint  Patient presents with  . Arthritis    F/U BL arthritis Pt. states," Rt ons is good, Lt one is better like if I sit for a long time and get up it bothers me; 4/10 occasional pain." tx: none -n oswelling     69 y.o. female presents with the above complaint. History confirmed with patient.   Objective:  Physical Exam: warm, good capillary refill, no trophic changes or ulcerative lesions, normal DP and PT pulses and normal sensory exam. Dorsal midfoot exostosis with POP left, no pain right  Assessment:   1. Arthritis of midfoot    Plan:  Patient was evaluated and treated and all questions answered.  Arthritis -Repeat injection left  Procedure: Joint Injection Location: Left 2nd TMT joint Skin Prep: Alcohol. Injectate: 0.5 cc 1% lidocaine plain, 0.5 cc dexamethasone phosphate. Disposition: Patient tolerated procedure well. Injection site dressed with a band-aid.      No follow-ups on file.

## 2020-07-28 DIAGNOSIS — Z20822 Contact with and (suspected) exposure to covid-19: Secondary | ICD-10-CM | POA: Diagnosis not present

## 2020-09-03 ENCOUNTER — Ambulatory Visit: Payer: Medicare Other | Admitting: Podiatry

## 2020-09-03 DIAGNOSIS — M25562 Pain in left knee: Secondary | ICD-10-CM | POA: Diagnosis not present

## 2020-09-03 DIAGNOSIS — M1712 Unilateral primary osteoarthritis, left knee: Secondary | ICD-10-CM | POA: Diagnosis not present

## 2020-09-04 ENCOUNTER — Ambulatory Visit (AMBULATORY_SURGERY_CENTER): Payer: Self-pay | Admitting: *Deleted

## 2020-09-04 ENCOUNTER — Other Ambulatory Visit: Payer: Self-pay

## 2020-09-04 VITALS — Ht 71.0 in | Wt 224.0 lb

## 2020-09-04 DIAGNOSIS — Z86010 Personal history of colonic polyps: Secondary | ICD-10-CM

## 2020-09-04 MED ORDER — SUTAB 1479-225-188 MG PO TABS: 1.0000 | ORAL_TABLET | ORAL | 0 refills | Status: DC

## 2020-09-04 NOTE — Progress Notes (Signed)
Patient is here in-person for PV. Patient denies any allergies to eggs or soy. Patient denies any problems with anesthesia/sedation. Patient denies any oxygen use at home. Patient denies taking any diet/weight loss medications or blood thinners. Patient is not being treated for MRSA or C-diff. Patient is aware of our care-partner policy and [id number] safety protocol.   COVID-19 vaccines completed on 01/2020, per patient.   Prep Prescription coupon given to the patient.

## 2020-09-17 ENCOUNTER — Ambulatory Visit (AMBULATORY_SURGERY_CENTER): Payer: Medicare Other | Admitting: Internal Medicine

## 2020-09-17 ENCOUNTER — Other Ambulatory Visit: Payer: Self-pay

## 2020-09-17 ENCOUNTER — Encounter: Payer: Self-pay | Admitting: Internal Medicine

## 2020-09-17 VITALS — BP 131/83 | HR 86 | Temp 96.9°F | Resp 13 | Ht 71.0 in | Wt 224.0 lb

## 2020-09-17 DIAGNOSIS — Z86010 Personal history of colonic polyps: Secondary | ICD-10-CM | POA: Diagnosis not present

## 2020-09-17 DIAGNOSIS — D123 Benign neoplasm of transverse colon: Secondary | ICD-10-CM

## 2020-09-17 DIAGNOSIS — I10 Essential (primary) hypertension: Secondary | ICD-10-CM | POA: Diagnosis not present

## 2020-09-17 DIAGNOSIS — E785 Hyperlipidemia, unspecified: Secondary | ICD-10-CM | POA: Diagnosis not present

## 2020-09-17 MED ORDER — SODIUM CHLORIDE 0.9 % IV SOLN: 500.0000 mL | Freq: Once | INTRAVENOUS | Status: DC

## 2020-09-17 NOTE — Progress Notes (Signed)
Called to room to assist during endoscopic procedure.  Patient ID and intended procedure confirmed with present staff. Received instructions for my participation in the procedure from the performing physician.  

## 2020-09-17 NOTE — Progress Notes (Signed)
VS-  Christell Constant RN  Pt's states no medical or surgical changes since previsit or office visit.

## 2020-09-17 NOTE — Progress Notes (Signed)
Report to PACU, RN, vss, BBS= Clear.  

## 2020-09-17 NOTE — Patient Instructions (Signed)
° °Handouts on polyps & hemorrhoids given to you today  ° °Await pathology results on polyps removed  ° ° ° °YOU HAD AN ENDOSCOPIC PROCEDURE TODAY AT THE [facility]:   Refer to the procedure report that was given to you for any specific questions about what was found during the examination.  If the procedure report does not answer your questions, please call your gastroenterologist to clarify.  If you requested that your care partner not be given the details of your procedure findings, then the procedure report has been included in a sealed envelope for you to review at your convenience later. ° °YOU SHOULD EXPECT: Some feelings of bloating in the abdomen. Passage of more gas than usual.  Walking can help get rid of the air that was put into your GI tract during the procedure and reduce the bloating. If you had a lower endoscopy (such as a colonoscopy or flexible sigmoidoscopy) you may notice spotting of blood in your stool or on the toilet paper. If you underwent a bowel prep for your procedure, you may not have a normal bowel movement for a few days. ° °Please Note:  You might notice some irritation and congestion in your nose or some drainage.  This is from the oxygen used during your procedure.  There is no need for concern and it should clear up in a day or so. ° °SYMPTOMS TO REPORT IMMEDIATELY: ° °Following lower endoscopy (colonoscopy or flexible sigmoidoscopy): ° Excessive amounts of blood in the stool ° Significant tenderness or worsening of abdominal pains ° Swelling of the abdomen that is new, acute ° Fever of 100°F or higher ° ° °For urgent or emergent issues, a gastroenterologist can be reached at any hour by calling [phone]. °Do not use MyChart messaging for urgent concerns.  ° ° °DIET:  We do recommend a small meal at first, but then you may proceed to your regular diet.  Drink plenty of fluids but you should avoid alcoholic beverages for 24 hours. ° °ACTIVITY:  You should plan  to take it easy for the rest of today and you should NOT DRIVE or use heavy machinery until tomorrow (because of the sedation medicines used during the test).   ° °FOLLOW UP: °Our staff will call the number listed on your records 48-72 hours following your procedure to check on you and address any questions or concerns that you may have regarding the information given to you following your procedure. If we do not reach you, we will leave a message.  We will attempt to reach you two times.  During this call, we will ask if you have developed any symptoms of COVID 19. If you develop any symptoms (ie: fever, flu-like symptoms, shortness of breath, cough etc.) before then, please call [phone].  If you test positive for Covid 19 in the 2 weeks post procedure, please call and report this information to us.   ° °If any biopsies were taken you will be contacted by phone or by letter within the next 1-3 weeks.  Please call us at [phone] if you have not heard about the biopsies in 3 weeks.  ° ° °SIGNATURES/CONFIDENTIALITY: °You and/or your care partner have signed paperwork which will be entered into your electronic medical record.  These signatures attest to the fact that that the information above on your After Visit Summary has been reviewed and is understood.  Full responsibility of the confidentiality of this discharge information lies with you   your care-partner.

## 2020-09-17 NOTE — Op Note (Signed)
Christine Hayes Patient Name: Christine Hayes Procedure Date: 09/17/2020 10:51 AM MRN: [id number] Endoscopist: Docia Chuck. Henrene Pastor , MD Age: 69 Referring MD:  Date of Birth: 14-May-1951 Gender: Female Account #: [id number] Procedure:                Colonoscopy with cold snare polypectomy x 2 Indications:              High risk colon cancer surveillance: Personal                            history of multiple (3 or more) adenomas. Previous                            examinations 2005, 2007, 2013, 2018 Medicines:                Monitored Anesthesia Care Procedure:                Pre-Anesthesia Assessment:                           - Prior to the procedure, a History and Physical                            was performed, and patient medications and                            allergies were reviewed. The patient's tolerance of                            previous anesthesia was also reviewed. The risks                            and benefits of the procedure and the sedation                            options and risks were discussed with the patient.                            All questions were answered, and informed consent                            was obtained. Prior Anticoagulants: The patient has                            taken no previous anticoagulant or antiplatelet                            agents. ASA Grade Assessment: II - A patient with                            mild systemic disease. After reviewing the risks                            and benefits, the patient was deemed in  satisfactory condition to undergo the procedure.                           After obtaining informed consent, the colonoscope                            was passed under direct vision. Throughout the                            procedure, the patient's blood pressure, pulse, and                            oxygen saturations were monitored continuously. The                             Colonoscope was introduced through the anus and                            advanced to the the cecum, identified by                            appendiceal orifice and ileocecal valve. The                            ileocecal valve, appendiceal orifice, and rectum                            were photographed. The quality of the bowel                            preparation was excellent. The colonoscopy was                            performed without difficulty. The patient tolerated                            the procedure well. The bowel preparation used was                            SUPREP via split dose instruction. Scope In: 10:58:59 AM Scope Out: 11:15:57 AM Scope Withdrawal Time: 0 hours 12 minutes 3 seconds  Total Procedure Duration: 0 hours 16 minutes 58 seconds  Findings:                 Two polyps were found in the transverse colon. The                            polyps were 2 mm in size. These polyps were removed                            with a cold snare. Resection and retrieval were                            complete.  Internal hemorrhoids were found during                            retroflexion. The hemorrhoids were small.                           The exam was otherwise without abnormality on                            direct and retroflexion views. Complications:            No immediate complications. Estimated blood loss:                            None. Estimated Blood Loss:     Estimated blood loss: none. Impression:               - Two 2 mm polyps in the transverse colon, removed                            with a cold snare. Resected and retrieved.                           - Internal hemorrhoids.                           - The examination was otherwise normal on direct                            and retroflexion views. Recommendation:           - Repeat colonoscopy in 5 years for surveillance.                           - Patient  has a contact number available for                            emergencies. The signs and symptoms of potential                            delayed complications were discussed with the                            patient. Return to normal activities tomorrow.                            Written discharge instructions were provided to the                            patient.                           - Resume previous diet.                           - Continue present medications.                           -  Await pathology results. Docia Chuck. Henrene Pastor, MD 09/17/2020 11:21:41 AM This report has been signed electronically.

## 2020-09-21 ENCOUNTER — Other Ambulatory Visit: Payer: Self-pay | Admitting: Nurse Practitioner

## 2020-09-21 ENCOUNTER — Telehealth: Payer: Self-pay | Admitting: *Deleted

## 2020-09-21 DIAGNOSIS — Z1231 Encounter for screening mammogram for malignant neoplasm of breast: Secondary | ICD-10-CM

## 2020-09-21 NOTE — Telephone Encounter (Signed)
  Follow up Call-  Call back number 09/17/2020  Post procedure Call Back phone  # [phone]  Permission to leave phone message Yes  Some recent data might be hidden     Patient questions:  Do you have a fever, pain , or abdominal swelling? No. Pain Score  0 *  Have you tolerated food without any problems? Yes.    Have you been able to return to your normal activities? Yes.    Do you have any questions about your discharge instructions: Diet   No. Medications  No. Follow up visit  No.  Do you have questions or concerns about your Care? No.  Actions: * If pain score is 4 or above: No action needed, pain <4  1. Have you developed a fever since your procedure? NO  2.   Have you had an respiratory symptoms (SOB or cough) since your procedure? NO  3.   Have you tested positive for COVID 19 since your procedure NO  4.   Have you had any family members/close contacts diagnosed with the COVID 19 since your procedure?  NO   If yes to any of these questions please route to Joylene John, RN and Joella Prince, RN

## 2020-09-22 ENCOUNTER — Encounter: Payer: Self-pay | Admitting: Internal Medicine

## 2020-10-08 DIAGNOSIS — M1712 Unilateral primary osteoarthritis, left knee: Secondary | ICD-10-CM | POA: Diagnosis not present

## 2020-10-08 DIAGNOSIS — M25562 Pain in left knee: Secondary | ICD-10-CM | POA: Diagnosis not present

## 2020-10-19 ENCOUNTER — Encounter: Payer: Self-pay | Admitting: Podiatry

## 2020-10-19 ENCOUNTER — Ambulatory Visit (INDEPENDENT_AMBULATORY_CARE_PROVIDER_SITE_OTHER): Payer: Medicare Other | Admitting: Podiatry

## 2020-10-19 ENCOUNTER — Other Ambulatory Visit: Payer: Self-pay

## 2020-10-19 DIAGNOSIS — M19079 Primary osteoarthritis, unspecified ankle and foot: Secondary | ICD-10-CM

## 2020-10-19 DIAGNOSIS — M19072 Primary osteoarthritis, left ankle and foot: Secondary | ICD-10-CM | POA: Diagnosis not present

## 2020-10-19 MED ORDER — MELOXICAM 7.5 MG PO TABS
7.5000 mg | ORAL_TABLET | Freq: Every day | ORAL | 1 refills | Status: DC
Start: 2020-10-19 — End: 2020-11-30

## 2020-10-19 NOTE — Progress Notes (Signed)
  Subjective:  Patient ID: Christine Hayes, female    DOB: Oct 12, 1951,  MRN: [id number]  Chief Complaint  Patient presents with  . Foot Problem    the left foot is bothering me again and the pain comes and goes    69 y.o. female presents with the above complaint. History confirmed with patient.   Objective:  Physical Exam: warm, good capillary refill, no trophic changes or ulcerative lesions, normal DP and PT pulses and normal sensory exam. Dorsal midfoot exostosis with POP left, no pain right  Assessment:   1. Arthritis of midfoot    Plan:  Patient was evaluated and treated and all questions answered.  Arthritis -Final repeat injection as below -Refill Meloxicam  Procedure: Joint Injection Location: Left 2nd TMT joint Skin Prep: Alcohol. Injectate: 0.5 cc 1% lidocaine plain, 0.5 cc dexamethasone phosphate. Disposition: Patient tolerated procedure well. Injection site dressed with a band-aid.        No follow-ups on file.

## 2020-10-21 DIAGNOSIS — M1712 Unilateral primary osteoarthritis, left knee: Secondary | ICD-10-CM | POA: Diagnosis not present

## 2020-10-21 DIAGNOSIS — M25562 Pain in left knee: Secondary | ICD-10-CM | POA: Diagnosis not present

## 2020-10-22 ENCOUNTER — Other Ambulatory Visit: Payer: Self-pay

## 2020-10-22 ENCOUNTER — Ambulatory Visit
Admission: RE | Admit: 2020-10-22 | Discharge: 2020-10-22 | Disposition: A | Discharge status: Home / Self Care | Payer: Medicare Other | Source: Ambulatory Visit | Attending: Nurse Practitioner | Admitting: Nurse Practitioner

## 2020-10-22 DIAGNOSIS — Z1231 Encounter for screening mammogram for malignant neoplasm of breast: Secondary | ICD-10-CM | POA: Diagnosis not present

## 2020-10-28 DIAGNOSIS — M25562 Pain in left knee: Secondary | ICD-10-CM | POA: Diagnosis not present

## 2020-10-28 DIAGNOSIS — M1712 Unilateral primary osteoarthritis, left knee: Secondary | ICD-10-CM | POA: Diagnosis not present

## 2020-11-30 ENCOUNTER — Encounter: Payer: Self-pay | Admitting: Podiatry

## 2020-11-30 ENCOUNTER — Other Ambulatory Visit: Payer: Self-pay

## 2020-11-30 ENCOUNTER — Ambulatory Visit (INDEPENDENT_AMBULATORY_CARE_PROVIDER_SITE_OTHER): Payer: Medicare Other | Admitting: Podiatry

## 2020-11-30 DIAGNOSIS — M19079 Primary osteoarthritis, unspecified ankle and foot: Secondary | ICD-10-CM | POA: Diagnosis not present

## 2020-11-30 DIAGNOSIS — M779 Enthesopathy, unspecified: Secondary | ICD-10-CM | POA: Diagnosis not present

## 2020-11-30 MED ORDER — MELOXICAM 7.5 MG PO TABS
7.5000 mg | ORAL_TABLET | Freq: Every day | ORAL | 2 refills | Status: DC
Start: 2020-11-30 — End: 2021-07-12

## 2020-11-30 NOTE — Progress Notes (Signed)
  Subjective:  Patient ID: UVA RUNKEL, female    DOB: 06-Jul-1951,  MRN: [id number]  No chief complaint on file.  69 y.o. female presents with the above complaint. History confirmed with patient. States the pain is doing much better, taking meloxicam without side effects.  Objective:  Physical Exam: warm, good capillary refill, no trophic changes or ulcerative lesions, normal DP and PT pulses and normal sensory exam. Dorsal midfoot exostosis without POP  Assessment:   1. Arthritis of midfoot   2. Capsulitis    Plan:  Patient was evaluated and treated and all questions answered.  Arthritis -No injection today. Discussed should pain persist would consider surgical intervention. Reviewed details of surgical procedure with patient. Hold off surgical intervention. -Refill Meloxicam.   No follow-ups on file.

## 2020-12-02 DIAGNOSIS — L259 Unspecified contact dermatitis, unspecified cause: Secondary | ICD-10-CM | POA: Diagnosis not present

## 2020-12-02 DIAGNOSIS — Q828 Other specified congenital malformations of skin: Secondary | ICD-10-CM | POA: Diagnosis not present

## 2020-12-02 DIAGNOSIS — D485 Neoplasm of uncertain behavior of skin: Secondary | ICD-10-CM | POA: Diagnosis not present

## 2020-12-08 DIAGNOSIS — N762 Acute vulvitis: Secondary | ICD-10-CM | POA: Diagnosis not present

## 2020-12-30 DIAGNOSIS — Z1152 Encounter for screening for COVID-19: Secondary | ICD-10-CM | POA: Diagnosis not present

## 2021-02-16 DIAGNOSIS — B079 Viral wart, unspecified: Secondary | ICD-10-CM | POA: Diagnosis not present

## 2021-02-16 DIAGNOSIS — L821 Other seborrheic keratosis: Secondary | ICD-10-CM | POA: Diagnosis not present

## 2021-02-16 DIAGNOSIS — L814 Other melanin hyperpigmentation: Secondary | ICD-10-CM | POA: Diagnosis not present

## 2021-02-16 DIAGNOSIS — L309 Dermatitis, unspecified: Secondary | ICD-10-CM | POA: Diagnosis not present

## 2021-02-16 DIAGNOSIS — D229 Melanocytic nevi, unspecified: Secondary | ICD-10-CM | POA: Diagnosis not present

## 2021-02-16 DIAGNOSIS — D485 Neoplasm of uncertain behavior of skin: Secondary | ICD-10-CM | POA: Diagnosis not present

## 2021-02-16 DIAGNOSIS — L819 Disorder of pigmentation, unspecified: Secondary | ICD-10-CM | POA: Diagnosis not present

## 2021-02-17 DIAGNOSIS — M1712 Unilateral primary osteoarthritis, left knee: Secondary | ICD-10-CM | POA: Diagnosis not present

## 2021-02-17 DIAGNOSIS — M170 Bilateral primary osteoarthritis of knee: Secondary | ICD-10-CM | POA: Diagnosis not present

## 2021-02-17 DIAGNOSIS — M25362 Other instability, left knee: Secondary | ICD-10-CM | POA: Diagnosis not present

## 2021-02-17 DIAGNOSIS — M25562 Pain in left knee: Secondary | ICD-10-CM | POA: Diagnosis not present

## 2021-02-17 DIAGNOSIS — M21162 Varus deformity, not elsewhere classified, left knee: Secondary | ICD-10-CM | POA: Diagnosis not present

## 2021-02-24 DIAGNOSIS — M25562 Pain in left knee: Secondary | ICD-10-CM | POA: Diagnosis not present

## 2021-02-24 DIAGNOSIS — M1712 Unilateral primary osteoarthritis, left knee: Secondary | ICD-10-CM | POA: Diagnosis not present

## 2021-03-03 DIAGNOSIS — M1712 Unilateral primary osteoarthritis, left knee: Secondary | ICD-10-CM | POA: Diagnosis not present

## 2021-03-03 DIAGNOSIS — M25562 Pain in left knee: Secondary | ICD-10-CM | POA: Diagnosis not present

## 2021-03-10 DIAGNOSIS — M1712 Unilateral primary osteoarthritis, left knee: Secondary | ICD-10-CM | POA: Diagnosis not present

## 2021-03-10 DIAGNOSIS — M25562 Pain in left knee: Secondary | ICD-10-CM | POA: Diagnosis not present

## 2021-03-15 DIAGNOSIS — Z1331 Encounter for screening for depression: Secondary | ICD-10-CM | POA: Diagnosis not present

## 2021-03-15 DIAGNOSIS — Z6832 Body mass index (BMI) 32.0-32.9, adult: Secondary | ICD-10-CM | POA: Diagnosis not present

## 2021-03-15 DIAGNOSIS — Z9181 History of falling: Secondary | ICD-10-CM | POA: Diagnosis not present

## 2021-03-15 DIAGNOSIS — I10 Essential (primary) hypertension: Secondary | ICD-10-CM | POA: Diagnosis not present

## 2021-03-15 DIAGNOSIS — Z139 Encounter for screening, unspecified: Secondary | ICD-10-CM | POA: Diagnosis not present

## 2021-03-15 DIAGNOSIS — E785 Hyperlipidemia, unspecified: Secondary | ICD-10-CM | POA: Diagnosis not present

## 2021-03-15 DIAGNOSIS — R739 Hyperglycemia, unspecified: Secondary | ICD-10-CM | POA: Diagnosis not present

## 2021-03-17 DIAGNOSIS — M25562 Pain in left knee: Secondary | ICD-10-CM | POA: Diagnosis not present

## 2021-03-17 DIAGNOSIS — M1712 Unilateral primary osteoarthritis, left knee: Secondary | ICD-10-CM | POA: Diagnosis not present

## 2021-05-07 DIAGNOSIS — Z23 Encounter for immunization: Secondary | ICD-10-CM | POA: Diagnosis not present

## 2021-06-15 DIAGNOSIS — I10 Essential (primary) hypertension: Secondary | ICD-10-CM | POA: Diagnosis not present

## 2021-06-15 DIAGNOSIS — R251 Tremor, unspecified: Secondary | ICD-10-CM | POA: Diagnosis not present

## 2021-06-15 DIAGNOSIS — Z6831 Body mass index (BMI) 31.0-31.9, adult: Secondary | ICD-10-CM | POA: Diagnosis not present

## 2021-06-15 DIAGNOSIS — G4700 Insomnia, unspecified: Secondary | ICD-10-CM | POA: Diagnosis not present

## 2021-06-15 DIAGNOSIS — E785 Hyperlipidemia, unspecified: Secondary | ICD-10-CM | POA: Diagnosis not present

## 2021-06-15 DIAGNOSIS — Z853 Personal history of malignant neoplasm of breast: Secondary | ICD-10-CM | POA: Diagnosis not present

## 2021-06-23 DIAGNOSIS — M1712 Unilateral primary osteoarthritis, left knee: Secondary | ICD-10-CM | POA: Diagnosis not present

## 2021-06-23 DIAGNOSIS — M25562 Pain in left knee: Secondary | ICD-10-CM | POA: Diagnosis not present

## 2021-06-24 ENCOUNTER — Other Ambulatory Visit: Payer: Self-pay

## 2021-06-24 ENCOUNTER — Ambulatory Visit (INDEPENDENT_AMBULATORY_CARE_PROVIDER_SITE_OTHER): Payer: Medicare Other | Admitting: Podiatry

## 2021-06-24 ENCOUNTER — Encounter: Payer: Self-pay | Admitting: Podiatry

## 2021-06-24 DIAGNOSIS — M19079 Primary osteoarthritis, unspecified ankle and foot: Secondary | ICD-10-CM

## 2021-06-24 DIAGNOSIS — M19072 Primary osteoarthritis, left ankle and foot: Secondary | ICD-10-CM

## 2021-06-24 MED ORDER — TRIAMCINOLONE ACETONIDE 10 MG/ML IJ SUSP
5.0000 mg | Freq: Once | INTRAMUSCULAR | Status: AC
  Administered 2021-06-24: 5 mg

## 2021-06-24 NOTE — Progress Notes (Signed)
  Subjective:  Patient ID: Christine Hayes, female    DOB: 05-22-1951,  MRN: [id number]  Chief Complaint  Patient presents with   Foot Problem    It is hurting worse at night and late evening and is sore and tender    70 y.o. female presents with the above complaint. History confirmed with patient. States the pain is doing much better, taking meloxicam without side effects.  Objective:  Physical Exam: warm, good capillary refill, no trophic changes or ulcerative lesions, normal DP and PT pulses and normal sensory exam. Dorsal midfoot exostosis without POP  Assessment:   1. Arthritis of midfoot     Plan:  Patient was evaluated and treated and all questions answered.  Arthritis -Repeat injection today -Discussed surgical intervention if pain persists. Discussed r/b and post-op course. Would trial injection before proceeding with surgery.  Procedure: Joint Injection Location: Left dorsal 2nd TMT joint Skin Prep: Alcohol. Injectate: 0.5 cc 1% lidocaine plain, 0.5 cc betamethasone acetate-betamethasone sodium phosphate Disposition: Patient tolerated procedure well. Injection site dressed with a band-aid.  Return in about 6 weeks (around 08/05/2021).

## 2021-07-08 ENCOUNTER — Encounter: Payer: Self-pay | Admitting: Diagnostic Neuroimaging

## 2021-07-12 ENCOUNTER — Ambulatory Visit (INDEPENDENT_AMBULATORY_CARE_PROVIDER_SITE_OTHER): Payer: Medicare Other | Admitting: Diagnostic Neuroimaging

## 2021-07-12 ENCOUNTER — Encounter: Payer: Self-pay | Admitting: Diagnostic Neuroimaging

## 2021-07-12 VITALS — BP 126/84 | HR 92 | Ht 71.0 in | Wt 217.4 lb

## 2021-07-12 DIAGNOSIS — R251 Tremor, unspecified: Secondary | ICD-10-CM | POA: Diagnosis not present

## 2021-07-12 DIAGNOSIS — G20 Parkinson's disease: Secondary | ICD-10-CM | POA: Diagnosis not present

## 2021-07-12 MED ORDER — CARBIDOPA-LEVODOPA 25-100 MG PO TABS: 0.5000 | ORAL_TABLET | Freq: Three times a day (TID) | ORAL | 6 refills | Status: DC

## 2021-07-12 MED ORDER — ALPRAZOLAM 0.5 MG PO TABS: ORAL_TABLET | ORAL | 0 refills | Status: DC

## 2021-07-12 NOTE — Progress Notes (Signed)
[facility]  PATIENT: Christine Hayes DOB: [birth year]  REFERRING CLINICIAN: Penelope Coop, FNP HISTORY FROM: patient  REASON FOR VISIT: new consult    HISTORICAL  CHIEF COMPLAINT:  Chief Complaint  Patient presents with   New Patient (Initial Visit)    Rm 6  alone Here for consult on right hand tremor. Reports sx are intermitted and more noticeable in the morning. Sx have been present since Dec of 2021, she is right hand dominate and feels like her cursive hand writing has declined due to this. Reports being cold/anxious seems to be a trigger for her s/x. Has not tired any treatment/meds for this.    HISTORY OF PRESENT ILLNESS:   70 year old female here for evaluation of right hand tremor.  Symptoms started in December 2021.  She noted intermittent brief tremor in her right hand and fingers when she is using a pen and writing cursive, sometimes during anxiety, sometimes when she feels cold.  No gait or balance difficulties.  No change in smell or taste.  No sleep issues or acting out dreams.  Family history of tremor in her mother but diagnosis was not clear.    REVIEW OF SYSTEMS: Full 14 system review of systems performed and negative with exception of: as per HPI.  ALLERGIES: No Known Allergies  HOME MEDICATIONS: Outpatient Medications Prior to Visit  Medication Sig Dispense Refill   Cholecalciferol (VITAMIN D3) 1.25 MG (50000 UT) CAPS Take by mouth 3 (three) times daily.     psyllium (METAMUCIL) 58.6 % packet Take 1 packet by mouth as needed.     aspirin EC 81 MG tablet Take 81 mg by mouth daily.     diazepam (VALIUM) 5 MG tablet diazepam 5 mg tablet  TAKE ONE TABLET BY MOUTH 1 HOUR PRIOR TO MRI. REPEAT AS NEEDED. DRIVER NEEDED.     Melatonin 10 MG TBDP Take by mouth.     olmesartan (BENICAR) 40 MG tablet Take 40 mg by mouth daily.     rosuvastatin (CRESTOR) 40 MG tablet Take 40 mg by mouth daily.     Sodium Sulfate-Mag Sulfate-KCl (SUTAB)  [phone] MG TABS Sutab 1.479-0.188-0.225 gram tablet     ciprofloxacin (CIPRO) 500 MG tablet ciprofloxacin 500 mg tablet  TAKE ONE TABLET BY MOUTH TWICE DAILY     clotrimazole-betamethasone (LOTRISONE) cream clotrimazole-betamethasone 1 %-0.05 % topical cream  APPLY TO THE AFFECTED AND SURROUNDING AREAS OF SKIN BY TOPICAL ROUTE 2 TIMES PER DAY as needed     meloxicam (MOBIC) 7.5 MG tablet Take 1 tablet (7.5 mg total) by mouth daily. 30 tablet 2   metroNIDAZOLE (FLAGYL) 500 MG tablet metronidazole 500 mg tablet  TAKE ONE TABLET BY MOUTH 3 TIMES DAILY     triamcinolone cream (KENALOG) 0.1 % triamcinolone acetonide 0.1 % topical cream  Apply a small amount to affected area twice a day as needed     No facility-administered medications prior to visit.    PAST MEDICAL HISTORY: Past Medical History:  Diagnosis Date   Anxiety    Breast cancer ([place]) 1995   left breast cancer   HTN (hypertension)    Hyperlipidemia    Hypertension    Insomnia    Personal history of chemotherapy 1995   Personal history of radiation therapy 1995   Vitamin D deficiency disease     PAST SURGICAL HISTORY: Past Surgical History:  Procedure Laterality Date   BREAST BIOPSY Left 1995   malignant   BREAST LUMPECTOMY Left 1995  BREAST RECONSTRUCTION  2002   left and breast reduction right breast   COLONOSCOPY  07/12/2017   Henrene Pastor   DILATION AND CURETTAGE OF UTERUS  1985   POLYPECTOMY     REDUCTION MAMMAPLASTY Bilateral 2002    FAMILY HISTORY: Family History  Problem Relation Age of Onset   Hypertension Mother    Colon polyps Father    Dementia Father    Hyperlipidemia Brother    Colon cancer Neg Hx    Stomach cancer Neg Hx    Esophageal cancer Neg Hx    Rectal cancer Neg Hx     SOCIAL HISTORY: Social History   Socioeconomic History   Marital status: Married    Spouse name: Not on file   Number of children: Not on file   Years of education: Not on file   Highest education level: Not on  file  Occupational History   Not on file  Tobacco Use   Smoking status: Former    Types: Cigarettes    Quit date: 05/11/1979    Years since quitting: 42.2   Smokeless tobacco: Never  Vaping Use   Vaping Use: Never used  Substance and Sexual Activity   Alcohol use: Yes    Alcohol/week: 2.0 standard drinks    Types: 2 Glasses of wine per week   Drug use: No   Sexual activity: Not on file  Other Topics Concern   Not on file  Social History Narrative   Right handed    Caffeine - Occasional use a few times per month   Lives at home with her husband.    MB< RN 07/12/21   Social Determinants of Health   Financial Resource Strain: Not on file  Food Insecurity: Not on file  Transportation Needs: Not on file  Physical Activity: Not on file  Stress: Not on file  Social Connections: Not on file  Intimate Partner Violence: Not on file     PHYSICAL EXAM  GENERAL EXAM/CONSTITUTIONAL: Vitals:  Vitals:   07/12/21 1603  BP: 126/84  Pulse: 92  SpO2: 97%  Weight: 217 lb 6 oz (98.6 kg)  Height: 5\' 11"  (1.803 m)   Body mass index is 30.32 kg/m. Wt Readings from Last 3 Encounters:  07/12/21 217 lb 6 oz (98.6 kg)  09/17/20 224 lb (101.6 kg)  09/04/20 224 lb (101.6 kg)   Patient is in no distress; well developed, nourished and groomed; neck is supple  CARDIOVASCULAR: Examination of carotid arteries is normal; no carotid bruits Regular rate and rhythm, no murmurs Examination of peripheral vascular system by observation and palpation is normal  EYES: Ophthalmoscopic exam of optic discs and posterior segments is normal; no papilledema or hemorrhages No results found.  MUSCULOSKELETAL: Gait, strength, tone, movements noted in Neurologic exam below  NEUROLOGIC: MENTAL STATUS:  No flowsheet data found. awake, alert, oriented to person, place and time recent and remote memory intact normal attention and concentration language fluent, comprehension intact, naming intact fund  of knowledge appropriate  CRANIAL NERVE:  2nd - no papilledema on fundoscopic exam 2nd, 3rd, 4th, 6th - pupils equal and reactive to light, visual fields full to confrontation, extraocular muscles intact, no nystagmus 5th - facial sensation symmetric 7th - facial strength symmetric 8th - hearing intact 9th - palate elevates symmetrically, uvula midline 11th - shoulder shrug --> DECR ON RIGHT SHOULDER 12th - tongue protrusion midline SOFT VOICE MASKED FACIES  MOTOR:  RARE RESTING TREMOR IN RUE COGWHEELING RIGIDITY IN RUE >> LUE NO  BRADYKINESIA IN BUE OR BLE normal bulk and tone, full strength in the BUE, BLE  SENSORY:  normal and symmetric to light touch, temperature, vibration  COORDINATION:  finger-nose-finger, fine finger movements --. MILD DYSMETRIA  REFLEXES:  deep tendon reflexes 1+  and symmetric  GAIT/STATION:  narrow based gait; DECR ARM SWING ON RIGHT; RIGHT SHOULDER DEPRESSED; ABLE TO WALK ON TOES AND HEELS AND TANDEM     DIAGNOSTIC DATA (LABS, IMAGING, TESTING) - I reviewed patient records, labs, notes, testing and imaging myself where available.  No results found for: WBC, HGB, HCT, MCV, PLT No results found for: NA, K, CL, CO2, GLUCOSE, BUN, CREATININE, CALCIUM, PROT, ALBUMIN, AST, ALT, ALKPHOS, BILITOT, GFRNONAA, GFRAA No results found for: CHOL, HDL, LDLCALC, LDLDIRECT, TRIG, CHOLHDL No results found for: HGBA1C No results found for: VITAMINB12 No results found for: TSH     ASSESSMENT AND PLAN  70 y.o. year old female here with new onset resting tremor, cogwheel rigidity, masked facies, decreased arm swing, with signs and's symptoms suggestive of idiopathic Parkinson's disease.  We will proceed with further work-up with MRI of the brain.  We will try empiric trial of carbidopa levodopa.    Dx:  1. Tremor   2. Parkinsonism, unspecified Parkinsonism type ([place])   3. Parkinson's disease ([place])     PLAN:  - carb/levo 25/100 half three times a day  with meals x 1-2 weeks; then 1 tab three times a day with meals - check MRI brain - start exercise program  Orders Placed This Encounter  Procedures   MR BRAIN W WO CONTRAST   Meds ordered this encounter  Medications   carbidopa-levodopa (SINEMET IR) 25-100 MG tablet    Sig: Take 0.5-1 tablets by mouth 3 (three) times daily.    Dispense:  90 tablet    Refill:  6   ALPRAZolam (XANAX) 0.5 MG tablet    Sig: for sedation before MRI scan; take 1 tab 1 hour before scan; may repeat 1 tab 15 min before scan    Dispense:  3 tablet    Refill:  0   Return in about 3 months (around 10/12/2021).    Penni Bombard, MD 8/89/1694, 5:03 PM Certified in Neurology, Neurophysiology and Neuroimaging  [facility] [address] [place], [place] [address] [phone]

## 2021-07-12 NOTE — Patient Instructions (Signed)
-   carb/levo 25/100 half three times a day with meals x 1-2 weeks; then 1 tab three times a day with meals  - check MRI brain  - start exercise program

## 2021-07-14 ENCOUNTER — Telehealth: Payer: Self-pay | Admitting: Diagnostic Neuroimaging

## 2021-07-14 DIAGNOSIS — M1712 Unilateral primary osteoarthritis, left knee: Secondary | ICD-10-CM | POA: Diagnosis not present

## 2021-07-14 DIAGNOSIS — M25562 Pain in left knee: Secondary | ICD-10-CM | POA: Diagnosis not present

## 2021-07-14 NOTE — Telephone Encounter (Signed)
Pt states she is on day 2 of taking carbidopa-levodopa (SINEMET IR) 25-100 MG tablet, earlier today her blood pressure was 104/64 and just a few mins ago it was 84/68.  Pt states she feels fine but wants Dr Leta Baptist to be aware, please call.

## 2021-07-14 NOTE — Telephone Encounter (Signed)
I spoke verbally with Dr. Leta Baptist on this. He recommends the pt stop Car/levo dose for a week and talk with PCP about BP and possible adjustments in the Benicar due to lower BP readings.  Pt was agreeable to this, and will plan to restart Carb/Levo dosage of 0.5 tabs tid at 25-100 mg on 07/21/21. Pt was also advised she could contact us after she speaks with her PCP and advise on what they say.

## 2021-07-19 DIAGNOSIS — Z6832 Body mass index (BMI) 32.0-32.9, adult: Secondary | ICD-10-CM | POA: Diagnosis not present

## 2021-07-19 DIAGNOSIS — I10 Essential (primary) hypertension: Secondary | ICD-10-CM | POA: Diagnosis not present

## 2021-07-21 DIAGNOSIS — M1712 Unilateral primary osteoarthritis, left knee: Secondary | ICD-10-CM | POA: Diagnosis not present

## 2021-07-21 DIAGNOSIS — M25562 Pain in left knee: Secondary | ICD-10-CM | POA: Diagnosis not present

## 2021-07-22 ENCOUNTER — Other Ambulatory Visit: Payer: Self-pay

## 2021-07-22 ENCOUNTER — Ambulatory Visit
Admission: RE | Admit: 2021-07-22 | Discharge: 2021-07-22 | Disposition: A | Discharge status: Home / Self Care | Payer: Medicare Other | Source: Ambulatory Visit | Attending: Diagnostic Neuroimaging | Admitting: Diagnostic Neuroimaging

## 2021-07-22 DIAGNOSIS — G20 Parkinson's disease: Secondary | ICD-10-CM

## 2021-07-22 DIAGNOSIS — R251 Tremor, unspecified: Secondary | ICD-10-CM

## 2021-07-22 MED ORDER — GADOBENATE DIMEGLUMINE 529 MG/ML IV SOLN
20.0000 mL | Freq: Once | INTRAVENOUS | Status: AC | PRN
  Administered 2021-07-22: 20 mL via INTRAVENOUS

## 2021-07-29 ENCOUNTER — Telehealth: Payer: Self-pay | Admitting: Diagnostic Neuroimaging

## 2021-07-29 ENCOUNTER — Telehealth: Payer: Self-pay

## 2021-07-29 NOTE — Telephone Encounter (Signed)
-----   Message from Penni Bombard, MD sent at 07/29/2021  1:39 PM EDT ----- Unremarkable imaging results. Please call patient. Continue current plan. -VRP

## 2021-07-29 NOTE — Telephone Encounter (Signed)
Please advise 

## 2021-07-29 NOTE — Telephone Encounter (Signed)
I called the pt and updated on MRI. She verbalized understanding and will keep f/u as scheduled for October.  Pt did mention she has resumed the Carb/Levo dosage and has been tolerating it well. Also reports her PCP has decreased her BP med by 1/2 and her BP levels have been stable. Pt will keep [place] updated on how she is doing.

## 2021-07-29 NOTE — Telephone Encounter (Signed)
Pt is asking to be called when MRI results are available.

## 2021-08-02 ENCOUNTER — Other Ambulatory Visit: Payer: Self-pay

## 2021-08-02 ENCOUNTER — Ambulatory Visit (INDEPENDENT_AMBULATORY_CARE_PROVIDER_SITE_OTHER): Payer: Medicare Other | Admitting: Podiatry

## 2021-08-02 DIAGNOSIS — M19079 Primary osteoarthritis, unspecified ankle and foot: Secondary | ICD-10-CM

## 2021-08-02 DIAGNOSIS — M779 Enthesopathy, unspecified: Secondary | ICD-10-CM | POA: Diagnosis not present

## 2021-08-02 NOTE — Progress Notes (Signed)
  Subjective:  Patient ID: Christine Hayes, female    DOB: 1951-10-23,  MRN: [id number]  Chief Complaint  Patient presents with   Arthritis    F/U Lt arthritis -pt states," it's wonderful, no pain." -no swelling tx;none   70 y.o. female presents with the above complaint. History confirmed with patient.    Objective:  Physical Exam: warm, good capillary refill, no trophic changes or ulcerative lesions, normal DP and PT pulses and normal sensory exam. Dorsal midfoot exostosis without POP  Assessment:   1. Arthritis of midfoot   2. Capsulitis    Plan:  Patient was evaluated and treated and all questions answered.  Arthritis -Hold off repeat injection today. Should pain persist consider repeat injection, advise patient to call as soon as it is wearing off. Surgical intervention only if injections fail to keep pain under control.  No follow-ups on file.

## 2021-08-06 IMAGING — MR MR HEAD WO/W CM
13 series · 48 of 48 positions shown · IV contrast (20 mL Multihance)
Comparison: None.

CLINICAL DATA: Tremor SRE.Y (2W4-CT-CM). Parkinsonism, unspecified
Parkinsonism type (HCC) G20 (2W4-CT-CM). Parkinson's disease (HCC)
G20 (2W4-CT-CM)

EXAM:
MRI HEAD WITHOUT AND WITH CONTRAST
TECHNIQUE: Multiplanar, multiecho pulse sequences of the brain and surrounding
structures were obtained without and with intravenous contrast.
CONTRAST:  20mL MULTIHANCE GADOBENATE DIMEGLUMINE 529 MG/ML IV SOLN

[Series 5: T1 · sagittal · 4.0mm · 0.75mm/px · 1 of 31 slices shown (1 of 3)]
[im 1/31]
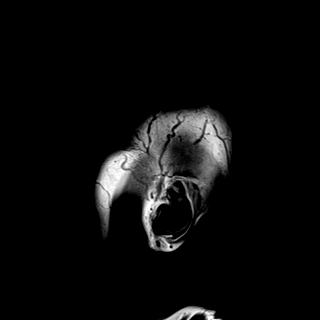

[Series 6: DWI · axial · 3.0mm · 0.94mm/px · z∈[-85,+69]mm · 9 of 176 slices shown (1 of 3)]
[im 1/176]
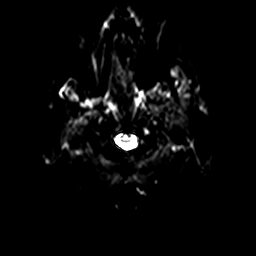
[im 22/176]
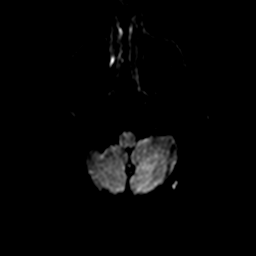
[im 44/176]
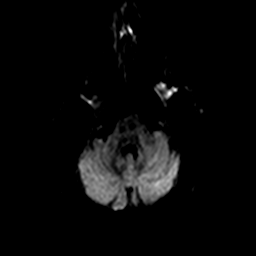
[im 66/176]
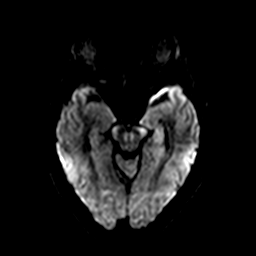
[im 88/176]
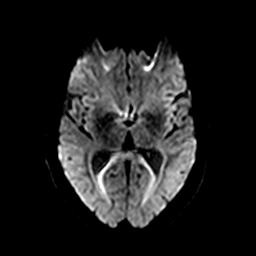
[im 110/176]
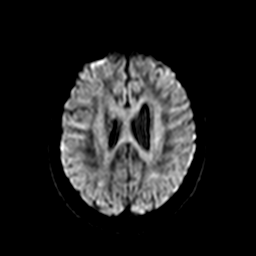
[im 132/176]
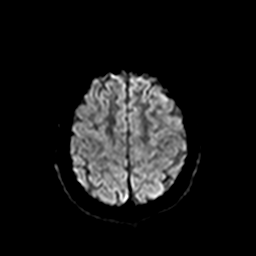
[im 154/176]
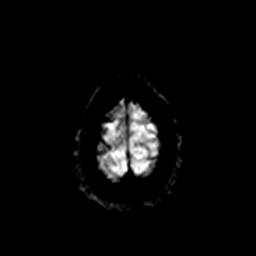
[im 176/176]
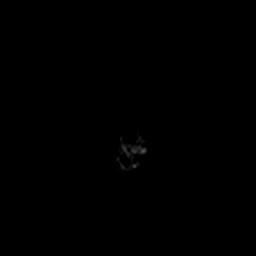

[Series 7: ax dwi_tracew · axial · 3.0mm · 0.94mm/px · z∈[-85,+69]mm · 4 of 88 slices shown]
[im 1/88]
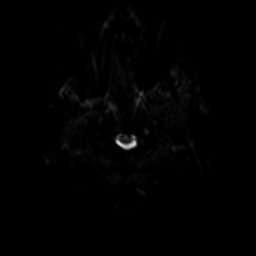
[im 30/88]
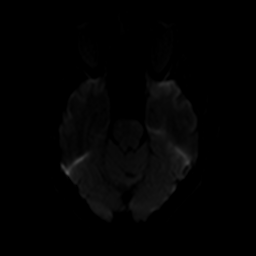
[im 59/88]
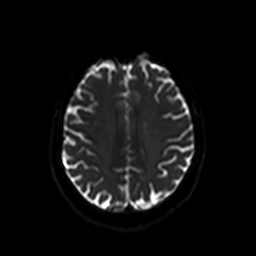
[im 88/88]
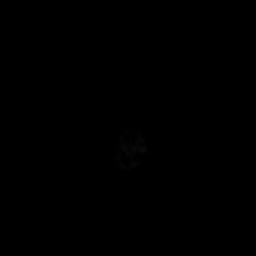

[Series 8: ax dwi_adc · axial · 3.0mm · 0.94mm/px · z∈[-85,+69]mm · 2 of 44 slices shown]
[im 1/44]
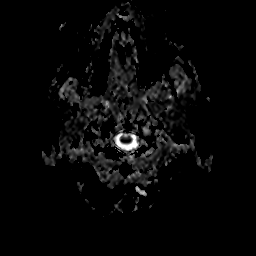
[im 44/44]
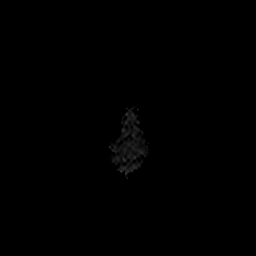

[Series 9: DWI · coronal · 5.0mm · 1.44mm/px · 3 of 64 slices shown (2 of 3)]
[im 1/64]
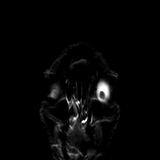
[im 32/64]
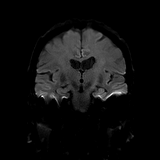
[im 64/64]
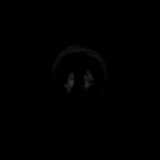

[Series 10: DWI · coronal · 5.0mm · 1.44mm/px · 2 of 32 slices shown (3 of 3)]
[im 1/32]
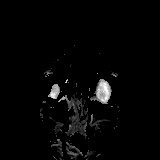
[im 32/32]
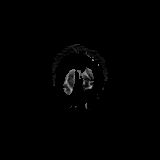

[Series 11: T2 · axial · 4.0mm · 0.36mm/px · 1 of 30 slices shown]
[im 1/30]
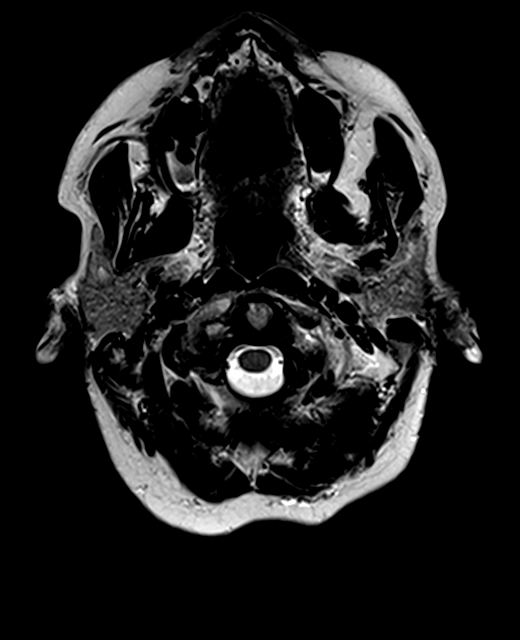

[Series 12: FLAIR · axial · 3.0mm · 0.72mm/px · 1 of 26 slices shown]
[im 1/26]
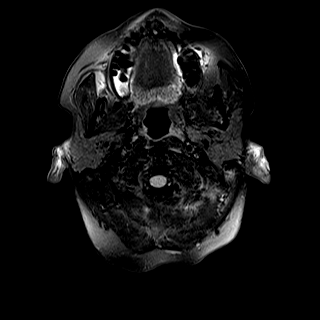

[Series 13: swi_images · axial · 1.5mm · 0.90mm/px · z∈[-75,+66]mm · 5 of 96 slices shown]
[im 1/96]
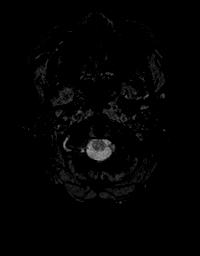
[im 24/96]
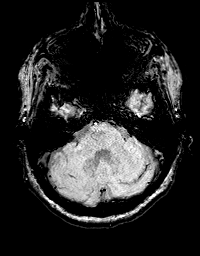
[im 48/96]
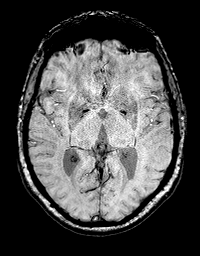
[im 72/96]
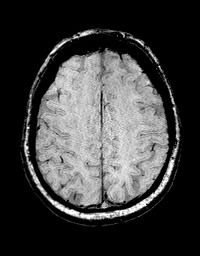
[im 96/96]
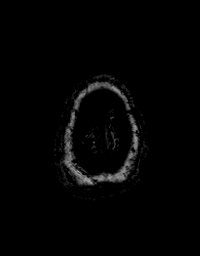

[Series 15: T1 · axial · 1.0mm · 0.94mm/px · z∈[-89,+69]mm · 8 of 160 slices shown (2 of 3)]
[im 1/160]
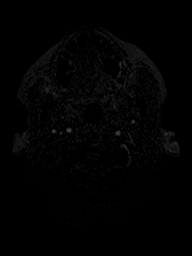
[im 23/160]
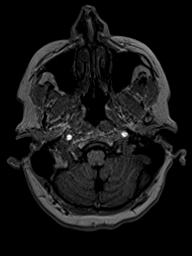
[im 46/160]
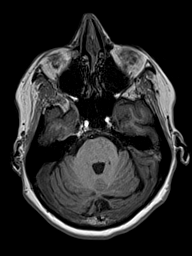
[im 69/160]
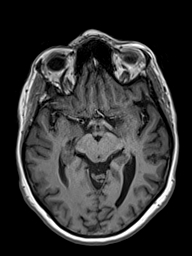
[im 91/160]
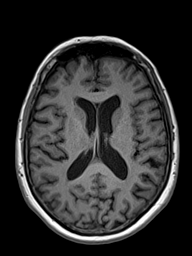
[im 114/160]
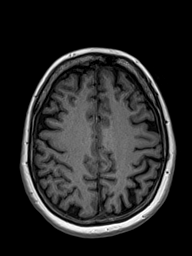
[im 137/160]
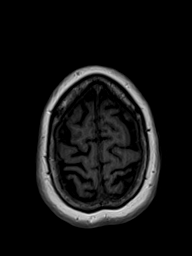
[im 160/160]
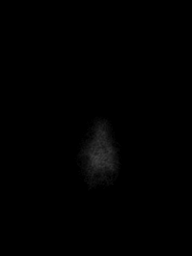

[Series 16: T2 post-contrast · coronal · 4.0mm · 0.36mm/px · 2 of 36 slices shown]
[im 1/36]
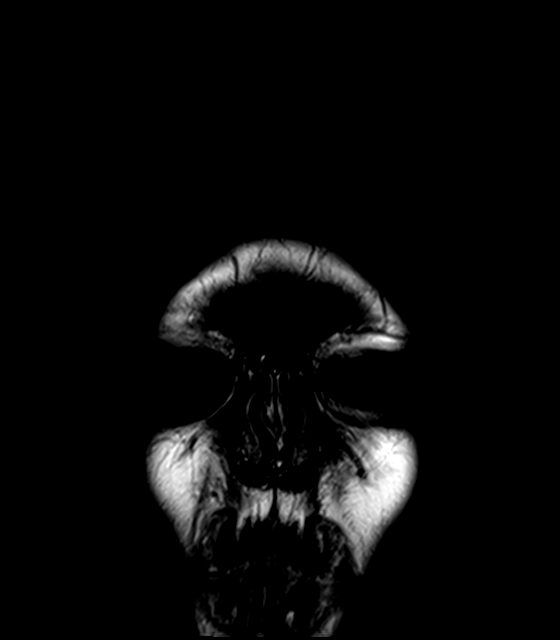
[im 36/36]
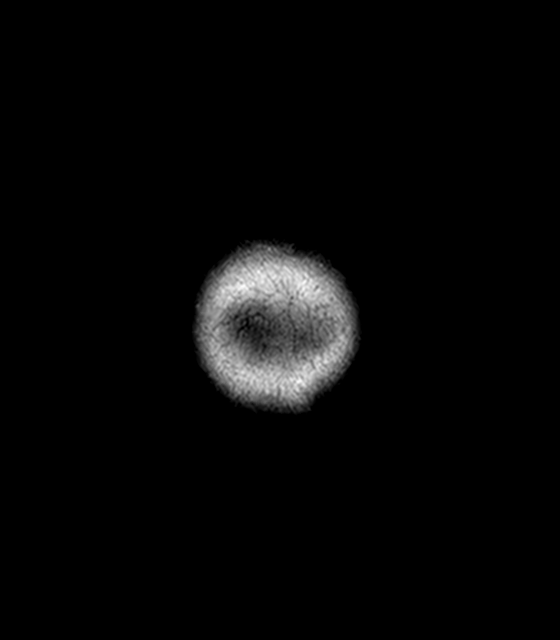

[Series 17: T1 · axial · 1.0mm · 0.94mm/px · z∈[-89,+69]mm · 8 of 160 slices shown (3 of 3)]
[im 1/160]
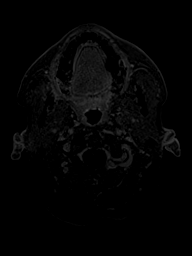
[im 23/160]
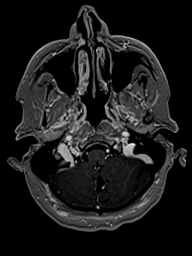
[im 46/160]
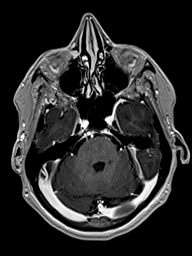
[im 69/160]
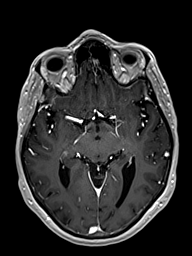
[im 91/160]
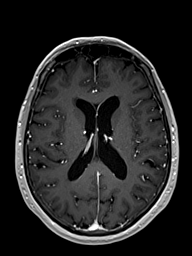
[im 114/160]
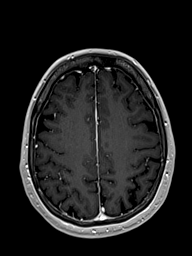
[im 137/160]
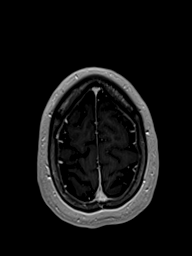
[im 160/160]
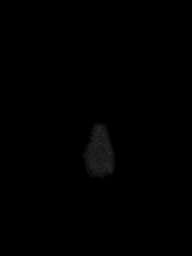

[Series 18: T1 post-contrast · coronal · 4.0mm · 0.72mm/px · 2 of 36 slices shown]
[im 1/36]
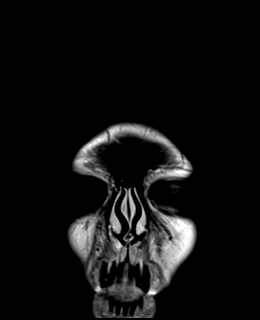
[im 36/36]
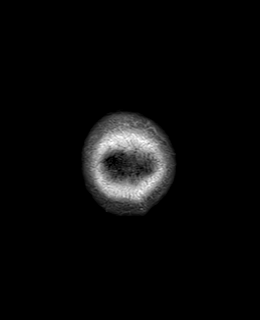

[48 of 48 positions shown; findings below may reference images not displayed]

FINDINGS: Brain: No acute infarction, hemorrhage, hydrocephalus, extra-axial
collection or mass lesion. Few scattered foci of T2 hyperintensity
are seen within the white matter of the cerebral hemispheres,
nonspecific, most likely related to chronic small vessel ischemia.
Mild parenchymal volume loss. No focus of abnormal contrast
enhancement.

Vascular: Normal flow voids.

Skull and upper cervical spine: Normal marrow signal.

Sinuses/Orbits: Negative.

Other: None.
IMPRESSION: Small amount of nonspecific T2 hyperintense lesions of the white
matter, may represent chronic microangiopathy.

## 2021-08-27 ENCOUNTER — Ambulatory Visit: Payer: Medicare Other | Admitting: Diagnostic Neuroimaging

## 2021-09-20 DIAGNOSIS — Z853 Personal history of malignant neoplasm of breast: Secondary | ICD-10-CM | POA: Diagnosis not present

## 2021-09-20 DIAGNOSIS — E785 Hyperlipidemia, unspecified: Secondary | ICD-10-CM | POA: Diagnosis not present

## 2021-09-20 DIAGNOSIS — E559 Vitamin D deficiency, unspecified: Secondary | ICD-10-CM | POA: Diagnosis not present

## 2021-09-20 DIAGNOSIS — I10 Essential (primary) hypertension: Secondary | ICD-10-CM | POA: Diagnosis not present

## 2021-09-20 DIAGNOSIS — Z1231 Encounter for screening mammogram for malignant neoplasm of breast: Secondary | ICD-10-CM | POA: Diagnosis not present

## 2021-09-20 DIAGNOSIS — Z6831 Body mass index (BMI) 31.0-31.9, adult: Secondary | ICD-10-CM | POA: Diagnosis not present

## 2021-09-29 ENCOUNTER — Other Ambulatory Visit: Payer: Self-pay | Admitting: Nurse Practitioner

## 2021-09-29 DIAGNOSIS — Z1231 Encounter for screening mammogram for malignant neoplasm of breast: Secondary | ICD-10-CM

## 2021-10-12 ENCOUNTER — Encounter: Payer: Self-pay | Admitting: Diagnostic Neuroimaging

## 2021-10-12 ENCOUNTER — Ambulatory Visit (INDEPENDENT_AMBULATORY_CARE_PROVIDER_SITE_OTHER): Payer: Medicare Other | Admitting: Diagnostic Neuroimaging

## 2021-10-12 VITALS — BP 124/81 | HR 96 | Ht 71.0 in | Wt 215.8 lb

## 2021-10-12 DIAGNOSIS — R251 Tremor, unspecified: Secondary | ICD-10-CM

## 2021-10-12 DIAGNOSIS — G20 Parkinson's disease: Secondary | ICD-10-CM

## 2021-10-12 MED ORDER — CARBIDOPA-LEVODOPA 25-100 MG PO TABS: 1.0000 | ORAL_TABLET | Freq: Three times a day (TID) | ORAL | 4 refills | Status: DC

## 2021-10-12 NOTE — Progress Notes (Signed)
[facility]  PATIENT: Christine Hayes DOB: 1951/04/27  REFERRING CLINICIAN: Ocie Doyne., MD HISTORY FROM: patient  REASON FOR VISIT: follow up  HISTORICAL  CHIEF COMPLAINT:  Chief Complaint  Patient presents with   Tremors    Rm 7,  3 month FU  "tremors have improved, no new concerns"    HISTORY OF PRESENT ILLNESS:   UPDATE (10/12/21, VRP): Since last visit, doing well. Symptoms are improved on carb /levo 1 tab three times a day (better movements, less tremor). No on - off issues. Gait stable. No side effects.   PRIOR HPI (07/12/21): 70 year old female here for evaluation of right hand tremor.  Symptoms started in December 2021.  She noted intermittent brief tremor in her right hand and fingers when she is using a pen and writing cursive, sometimes during anxiety, sometimes when she feels cold.  No gait or balance difficulties.  No change in smell or taste.  No sleep issues or acting out dreams.  Family history of tremor in her mother but diagnosis was not clear.    REVIEW OF SYSTEMS: Full 14 system review of systems performed and negative with exception of: as per HPI.  ALLERGIES: No Known Allergies  HOME MEDICATIONS: Outpatient Medications Prior to Visit  Medication Sig Dispense Refill   ALPRAZolam (XANAX) 0.5 MG tablet for sedation before MRI scan; take 1 tab 1 hour before scan; may repeat 1 tab 15 min before scan 3 tablet 0   aspirin EC 81 MG tablet Take 81 mg by mouth daily.     Cholecalciferol (VITAMIN D3) 1.25 MG (50000 UT) CAPS Take by mouth 3 (three) times daily.     diazepam (VALIUM) 5 MG tablet diazepam 5 mg tablet  TAKE ONE TABLET BY MOUTH 1 HOUR PRIOR TO MRI. REPEAT AS NEEDED. DRIVER NEEDED.     olmesartan (BENICAR) 20 MG tablet Take 20 mg by mouth daily.     psyllium (METAMUCIL) 58.6 % packet Take 1 packet by mouth as needed.     rosuvastatin (CRESTOR) 40 MG tablet Take 40 mg by mouth daily.     Sodium Sulfate-Mag Sulfate-KCl (SUTAB)  [phone] MG TABS Sutab 1.479-0.188-0.225 gram tablet     carbidopa-levodopa (SINEMET IR) 25-100 MG tablet Take 0.5-1 tablets by mouth 3 (three) times daily. 90 tablet 6   Melatonin 10 MG TBDP Take by mouth.     olmesartan (BENICAR) 40 MG tablet Take 40 mg by mouth daily.     No facility-administered medications prior to visit.    PAST MEDICAL HISTORY: Past Medical History:  Diagnosis Date   Anxiety    Breast cancer ([place]) 1995   left breast cancer   HTN (hypertension)    Hyperlipidemia    Hypertension    Insomnia    Personal history of chemotherapy 1995   Personal history of radiation therapy 1995   Vitamin D deficiency disease     PAST SURGICAL HISTORY: Past Surgical History:  Procedure Laterality Date   BREAST BIOPSY Left 1995   malignant   BREAST LUMPECTOMY Left 1995   BREAST RECONSTRUCTION  2002   left and breast reduction right breast   COLONOSCOPY  07/12/2017   Henrene Pastor   DILATION AND CURETTAGE OF UTERUS  1985   POLYPECTOMY     REDUCTION MAMMAPLASTY Bilateral 2002    FAMILY HISTORY: Family History  Problem Relation Age of Onset   Hypertension Mother    Colon polyps Father    Dementia Father    Hyperlipidemia  Brother    Colon cancer Neg Hx    Stomach cancer Neg Hx    Esophageal cancer Neg Hx    Rectal cancer Neg Hx     SOCIAL HISTORY: Social History   Socioeconomic History   Marital status: Married    Spouse name: Not on file   Number of children: Not on file   Years of education: Not on file   Highest education level: Not on file  Occupational History   Not on file  Tobacco Use   Smoking status: Former    Types: Cigarettes    Quit date: 05/11/1979    Years since quitting: 42.4   Smokeless tobacco: Never  Vaping Use   Vaping Use: Never used  Substance and Sexual Activity   Alcohol use: Yes    Alcohol/week: 2.0 standard drinks    Types: 2 Glasses of wine per week   Drug use: No   Sexual activity: Not on file  Other Topics Concern   Not  on file  Social History Narrative   Right handed    Caffeine - Occasional use a few times per month   Lives at home with her husband.    MB< RN 07/12/21   Social Determinants of Health   Financial Resource Strain: Not on file  Food Insecurity: Not on file  Transportation Needs: Not on file  Physical Activity: Not on file  Stress: Not on file  Social Connections: Not on file  Intimate Partner Violence: Not on file     PHYSICAL EXAM  GENERAL EXAM/CONSTITUTIONAL: Vitals:  Vitals:   10/12/21 1047  BP: 124/81  Pulse: 96  Weight: 215 lb 12.8 oz (97.9 kg)  Height: 5\' 11"  (1.803 m)   Body mass index is 30.1 kg/m. Wt Readings from Last 3 Encounters:  10/12/21 215 lb 12.8 oz (97.9 kg)  07/12/21 217 lb 6 oz (98.6 kg)  09/17/20 224 lb (101.6 kg)   Patient is in no distress; well developed, nourished and groomed; neck is supple  CARDIOVASCULAR: Examination of carotid arteries is normal; no carotid bruits Regular rate and rhythm, no murmurs Examination of peripheral vascular system by observation and palpation is normal  EYES: Ophthalmoscopic exam of optic discs and posterior segments is normal; no papilledema or hemorrhages No results found.  MUSCULOSKELETAL: Gait, strength, tone, movements noted in Neurologic exam below  NEUROLOGIC: MENTAL STATUS:  No flowsheet data found. awake, alert, oriented to person, place and time recent and remote memory intact normal attention and concentration language fluent, comprehension intact, naming intact fund of knowledge appropriate  CRANIAL NERVE:  2nd - no papilledema on fundoscopic exam 2nd, 3rd, 4th, 6th - pupils equal and reactive to light, visual fields full to confrontation, extraocular muscles intact, no nystagmus 5th - facial sensation symmetric 7th - facial strength symmetric 8th - hearing intact 9th - palate elevates symmetrically, uvula midline 11th - shoulder shrug --> DECR ON RIGHT SHOULDER 12th - tongue  protrusion midline SOFT VOICE MASKED FACIES  MOTOR:  NO TREMOR  COGWHEELING RIGIDITY IN RUE >> LUE MILD BRADYKINESIA IN RUE normal bulk and tone, full strength in the BUE, BLE  SENSORY:  normal and symmetric to light touch, temperature, vibration  COORDINATION:  finger-nose-finger, fine finger movements --. MILD DYSMETRIA  REFLEXES:  deep tendon reflexes 1+  and symmetric  GAIT/STATION:  narrow based gait; SLIGHT DECR ARM SWING ON RIGHT; GOOD STRIDE AND TURNING     DIAGNOSTIC DATA (LABS, IMAGING, TESTING) - I reviewed patient records, labs,  notes, testing and imaging myself where available.  No results found for: WBC, HGB, HCT, MCV, PLT No results found for: NA, K, CL, CO2, GLUCOSE, BUN, CREATININE, CALCIUM, PROT, ALBUMIN, AST, ALT, ALKPHOS, BILITOT, GFRNONAA, GFRAA No results found for: CHOL, HDL, LDLCALC, LDLDIRECT, TRIG, CHOLHDL No results found for: HGBA1C No results found for: VITAMINB12 No results found for: TSH    07/22/21 - Unremarkable MRI brain (with and without). No acute findings.    ASSESSMENT AND PLAN  70 y.o. year old female here with new onset resting tremor, cogwheel rigidity, masked facies, decreased arm swing, with signs and's symptoms suggestive of idiopathic Parkinson's disease.   Dx:  1. Parkinson's disease ([place])   2. Tremor      PLAN:   IDIOPATHIC PARKINSON'S DISEASE - continue carb/levo 25/100 - 1 tab three times a day with meals (good response) - continue exercise program  Meds ordered this encounter  Medications   carbidopa-levodopa (SINEMET IR) 25-100 MG tablet    Sig: Take 1 tablet by mouth 3 (three) times daily.    Dispense:  270 tablet    Refill:  4   Return in about 8 months (around 06/12/2022).    Penni Bombard, MD 13/88/7195, 97:47 AM Certified in Neurology, Neurophysiology and Neuroimaging  [facility] [address], [place], [place] [address] [phone]

## 2021-10-27 ENCOUNTER — Other Ambulatory Visit: Payer: Self-pay

## 2021-10-27 ENCOUNTER — Ambulatory Visit
Admission: RE | Admit: 2021-10-27 | Discharge: 2021-10-27 | Disposition: A | Discharge status: Home / Self Care | Payer: Medicare Other | Source: Ambulatory Visit | Attending: Nurse Practitioner | Admitting: Nurse Practitioner

## 2021-10-27 DIAGNOSIS — Z1231 Encounter for screening mammogram for malignant neoplasm of breast: Secondary | ICD-10-CM

## 2021-10-27 DIAGNOSIS — M25562 Pain in left knee: Secondary | ICD-10-CM | POA: Diagnosis not present

## 2021-10-27 DIAGNOSIS — M1712 Unilateral primary osteoarthritis, left knee: Secondary | ICD-10-CM | POA: Diagnosis not present

## 2021-11-02 DIAGNOSIS — B029 Zoster without complications: Secondary | ICD-10-CM | POA: Diagnosis not present

## 2021-11-03 DIAGNOSIS — M25562 Pain in left knee: Secondary | ICD-10-CM | POA: Diagnosis not present

## 2021-11-03 DIAGNOSIS — M1712 Unilateral primary osteoarthritis, left knee: Secondary | ICD-10-CM | POA: Diagnosis not present

## 2021-11-10 DIAGNOSIS — M1712 Unilateral primary osteoarthritis, left knee: Secondary | ICD-10-CM | POA: Diagnosis not present

## 2021-11-10 DIAGNOSIS — M25562 Pain in left knee: Secondary | ICD-10-CM | POA: Diagnosis not present

## 2021-11-15 DIAGNOSIS — M1712 Unilateral primary osteoarthritis, left knee: Secondary | ICD-10-CM | POA: Diagnosis not present

## 2021-11-15 DIAGNOSIS — M25562 Pain in left knee: Secondary | ICD-10-CM | POA: Diagnosis not present

## 2021-11-25 ENCOUNTER — Encounter: Payer: Self-pay | Admitting: Podiatry

## 2021-11-25 ENCOUNTER — Ambulatory Visit (INDEPENDENT_AMBULATORY_CARE_PROVIDER_SITE_OTHER): Payer: Medicare Other | Admitting: Podiatry

## 2021-11-25 DIAGNOSIS — M19079 Primary osteoarthritis, unspecified ankle and foot: Secondary | ICD-10-CM

## 2021-11-25 MED ORDER — BETAMETHASONE SOD PHOS & ACET 6 (3-3) MG/ML IJ SUSP
3.0000 mg | Freq: Once | INTRAMUSCULAR | Status: AC
Start: 2021-11-25 — End: 2021-11-25
  Administered 2021-11-25: 3 mg

## 2021-11-25 NOTE — Progress Notes (Signed)
  Subjective:  Patient ID: Christine Hayes, female    DOB: 1951-05-13,  MRN: [id number]  Chief Complaint  Patient presents with   Foot Pain    I am going out of the country in [place] and feb of next year and was hurting over the weekend and when I called on Monday seemed to be getting better but I wanted to call and see what he says on the left foot   70 y.o. female presents with the above complaint. History confirmed with patient.    Objective:  Physical Exam: warm, good capillary refill, no trophic changes or ulcerative lesions, normal DP and PT pulses and normal sensory exam. Dorsal midfoot exostosis without POP  Assessment:   1. Arthritis of midfoot    Plan:  Patient was evaluated and treated and all questions answered.  Arthritis -Repeat injection today  Procedure: Joint Injection Location: Left dorsal 2nd TMT joint Skin Prep: Alcohol. Injectate: 0.5 cc 1% lidocaine plain, 0.5 cc betamethasone acetate-betamethasone sodium phosphate Disposition: Patient tolerated procedure well. Injection site dressed with a band-aid.  No follow-ups on file.

## 2021-12-28 DIAGNOSIS — L03012 Cellulitis of left finger: Secondary | ICD-10-CM | POA: Diagnosis not present

## 2022-03-02 DIAGNOSIS — M1712 Unilateral primary osteoarthritis, left knee: Secondary | ICD-10-CM | POA: Diagnosis not present

## 2022-03-02 DIAGNOSIS — M25562 Pain in left knee: Secondary | ICD-10-CM | POA: Diagnosis not present

## 2022-03-09 ENCOUNTER — Telehealth: Payer: Self-pay | Admitting: Diagnostic Neuroimaging

## 2022-03-09 DIAGNOSIS — M25562 Pain in left knee: Secondary | ICD-10-CM | POA: Diagnosis not present

## 2022-03-09 DIAGNOSIS — M1712 Unilateral primary osteoarthritis, left knee: Secondary | ICD-10-CM | POA: Diagnosis not present

## 2022-03-09 DIAGNOSIS — Z20822 Contact with and (suspected) exposure to covid-19: Secondary | ICD-10-CM | POA: Diagnosis not present

## 2022-03-09 NOTE — Telephone Encounter (Signed)
Pt said, was told at last office visit if have any changes would increase carbidopa-levodopa (SINEMET IR) 25-100 MG tablet . Having tremor in right hand most of the time in the morning. Would like a call back. ?

## 2022-03-09 NOTE — Telephone Encounter (Signed)
Spoke with patient and advised per Dr Leta Baptist, she may increase up to 1.5 tabs TID; then up to 2 tabs TID if needed. I advised she may try increasing one dose at a time, try for 1-2 weeks. Then call back and let us know how she is doing. A new Rx can be sent in as needed. Patient verbalized understanding, appreciation. ? ?

## 2022-03-09 NOTE — Telephone Encounter (Signed)
Called patient who stated in the mornings her right hand has increased tremors now. It also is worse when she is cold. She stated Dr Leta Baptist told her carb/levo could be increased if needed. Her current dose is 25-100 mg 3 x day.   I advised her will send message to MD and let her know. Patient verbalized understanding, appreciation. ? ?

## 2022-03-16 DIAGNOSIS — M1712 Unilateral primary osteoarthritis, left knee: Secondary | ICD-10-CM | POA: Diagnosis not present

## 2022-03-16 DIAGNOSIS — M25562 Pain in left knee: Secondary | ICD-10-CM | POA: Diagnosis not present

## 2022-03-23 DIAGNOSIS — M25562 Pain in left knee: Secondary | ICD-10-CM | POA: Diagnosis not present

## 2022-03-23 DIAGNOSIS — M1712 Unilateral primary osteoarthritis, left knee: Secondary | ICD-10-CM | POA: Diagnosis not present

## 2022-03-30 DIAGNOSIS — M25562 Pain in left knee: Secondary | ICD-10-CM | POA: Diagnosis not present

## 2022-03-30 DIAGNOSIS — M1712 Unilateral primary osteoarthritis, left knee: Secondary | ICD-10-CM | POA: Diagnosis not present

## 2022-04-18 DIAGNOSIS — Z20822 Contact with and (suspected) exposure to covid-19: Secondary | ICD-10-CM | POA: Diagnosis not present

## 2022-04-28 DIAGNOSIS — Z20822 Contact with and (suspected) exposure to covid-19: Secondary | ICD-10-CM | POA: Diagnosis not present

## 2022-05-16 DIAGNOSIS — R7303 Prediabetes: Secondary | ICD-10-CM | POA: Diagnosis not present

## 2022-05-16 DIAGNOSIS — Z853 Personal history of malignant neoplasm of breast: Secondary | ICD-10-CM | POA: Diagnosis not present

## 2022-05-16 DIAGNOSIS — E785 Hyperlipidemia, unspecified: Secondary | ICD-10-CM | POA: Diagnosis not present

## 2022-05-16 DIAGNOSIS — Z1331 Encounter for screening for depression: Secondary | ICD-10-CM | POA: Diagnosis not present

## 2022-05-16 DIAGNOSIS — Z6832 Body mass index (BMI) 32.0-32.9, adult: Secondary | ICD-10-CM | POA: Diagnosis not present

## 2022-05-16 DIAGNOSIS — Z87898 Personal history of other specified conditions: Secondary | ICD-10-CM | POA: Diagnosis not present

## 2022-05-16 DIAGNOSIS — I10 Essential (primary) hypertension: Secondary | ICD-10-CM | POA: Diagnosis not present

## 2022-05-16 DIAGNOSIS — Z139 Encounter for screening, unspecified: Secondary | ICD-10-CM | POA: Diagnosis not present

## 2022-05-16 DIAGNOSIS — E559 Vitamin D deficiency, unspecified: Secondary | ICD-10-CM | POA: Diagnosis not present

## 2022-05-16 DIAGNOSIS — Z9181 History of falling: Secondary | ICD-10-CM | POA: Diagnosis not present

## 2022-06-09 DIAGNOSIS — M25561 Pain in right knee: Secondary | ICD-10-CM | POA: Diagnosis not present

## 2022-06-09 DIAGNOSIS — M1711 Unilateral primary osteoarthritis, right knee: Secondary | ICD-10-CM | POA: Diagnosis not present

## 2022-06-14 ENCOUNTER — Encounter: Payer: Self-pay | Admitting: Diagnostic Neuroimaging

## 2022-06-14 ENCOUNTER — Ambulatory Visit (INDEPENDENT_AMBULATORY_CARE_PROVIDER_SITE_OTHER): Payer: Medicare Other | Admitting: Diagnostic Neuroimaging

## 2022-06-14 VITALS — BP 129/74 | HR 87 | Ht 71.0 in | Wt 223.8 lb

## 2022-06-14 DIAGNOSIS — R251 Tremor, unspecified: Secondary | ICD-10-CM | POA: Diagnosis not present

## 2022-06-14 DIAGNOSIS — G20 Parkinson's disease: Secondary | ICD-10-CM

## 2022-06-14 MED ORDER — CARBIDOPA-LEVODOPA 25-100 MG PO TABS: 1.5000 | ORAL_TABLET | Freq: Three times a day (TID) | ORAL | 4 refills | Status: DC

## 2022-06-14 NOTE — Progress Notes (Signed)
[facility]  PATIENT: Christine Hayes DOB: 1951/10/12  REFERRING CLINICIAN: Ocie Doyne., MD HISTORY FROM: patient  REASON FOR VISIT: follow up  HISTORICAL  CHIEF COMPLAINT:  Chief Complaint  Patient presents with   Parkinson's disease    Rm 6, 8 month FU  "doing well"     HISTORY OF PRESENT ILLNESS:   UPDATE (06/14/22, VRP): Since last visit, doing WELL. Symptoms are mild. Tolerating carb/levo 1.5 tabs three times a day now.     UPDATE (10/12/21, VRP): Since last visit, doing well. Symptoms are improved on carb /levo 1 tab three times a day (better movements, less tremor). No on - off issues. Gait stable. No side effects.   PRIOR HPI (07/12/21): 71 year old female here for evaluation of right hand tremor.  Symptoms started in December 2021.  She noted intermittent brief tremor in her right hand and fingers when she is using a pen and writing cursive, sometimes during anxiety, sometimes when she feels cold.  No gait or balance difficulties.  No change in smell or taste.  No sleep issues or acting out dreams.  Family history of tremor in her mother but diagnosis was not clear.    REVIEW OF SYSTEMS: Full 14 system review of systems performed and negative with exception of: as per HPI.  ALLERGIES: No Known Allergies  HOME MEDICATIONS: Outpatient Medications Prior to Visit  Medication Sig Dispense Refill   ALPRAZolam (XANAX) 0.5 MG tablet for sedation before MRI scan; take 1 tab 1 hour before scan; may repeat 1 tab 15 min before scan 3 tablet 0   aspirin EC 81 MG tablet Take 81 mg by mouth daily.     Cholecalciferol (VITAMIN D3) 1.25 MG (50000 UT) CAPS Take by mouth 3 (three) times daily.     diazepam (VALIUM) 5 MG tablet diazepam 5 mg tablet  TAKE ONE TABLET BY MOUTH 1 HOUR PRIOR TO MRI. REPEAT AS NEEDED. DRIVER NEEDED.     fluocinonide cream (LIDEX) 0.05 % APPLY TO AFFECTED AREA TWICE A DAY     olmesartan (BENICAR) 20 MG tablet Take 20 mg by mouth daily.      psyllium (METAMUCIL) 58.6 % packet Take 1 packet by mouth as needed.     rosuvastatin (CRESTOR) 40 MG tablet Take 40 mg by mouth daily.     Sodium Sulfate-Mag Sulfate-KCl (SUTAB) [phone] MG TABS Sutab 1.479-0.188-0.225 gram tablet     carbidopa-levodopa (SINEMET IR) 25-100 MG tablet Take 1 tablet by mouth 3 (three) times daily. 270 tablet 4   valACYclovir (VALTREX) 1000 MG tablet Take 1,000 mg by mouth 3 (three) times daily.     No facility-administered medications prior to visit.    PAST MEDICAL HISTORY: Past Medical History:  Diagnosis Date   Anxiety    Breast cancer ([place]) 1995   left breast cancer   HTN (hypertension)    Hyperlipidemia    Hypertension    Insomnia    Parkinson's disease ([place])    Personal history of chemotherapy 1995   Personal history of radiation therapy 1995   Vitamin D deficiency disease     PAST SURGICAL HISTORY: Past Surgical History:  Procedure Laterality Date   BREAST BIOPSY Left 1995   malignant   BREAST LUMPECTOMY Left 1995   BREAST RECONSTRUCTION  2002   left and breast reduction right breast   COLONOSCOPY  07/12/2017   [place] CURETTAGE OF UTERUS  1985   POLYPECTOMY     REDUCTION  MAMMAPLASTY Bilateral 2002    FAMILY HISTORY: Family History  Problem Relation Age of Onset   Hypertension Mother    Colon polyps Father    Dementia Father    Hyperlipidemia Brother    Colon cancer Neg Hx    Stomach cancer Neg Hx    Esophageal cancer Neg Hx    Rectal cancer Neg Hx     SOCIAL HISTORY: Social History   Socioeconomic History   Marital status: Married    Spouse name: Jori Moll   Number of children: Not on file   Years of education: Not on file   Highest education level: Not on file  Occupational History   Not on file  Tobacco Use   Smoking status: Former    Types: Cigarettes    Quit date: 05/11/1979    Years since quitting: 43.1   Smokeless tobacco: Never  Vaping Use   Vaping Use: Never used  Substance and  Sexual Activity   Alcohol use: Yes    Alcohol/week: 2.0 standard drinks of alcohol    Types: 2 Glasses of wine per week   Drug use: No   Sexual activity: Not on file  Other Topics Concern   Not on file  Social History Narrative   Right handed    Caffeine - Occasional use a few times per month   Lives at home with her husband.    MB< RN 07/12/21   Social Determinants of Health   Financial Resource Strain: Not on file  Food Insecurity: Not on file  Transportation Needs: Not on file  Physical Activity: Not on file  Stress: Not on file  Social Connections: Not on file  Intimate Partner Violence: Not on file     PHYSICAL EXAM  GENERAL EXAM/CONSTITUTIONAL: Vitals:  Vitals:   06/14/22 1010  BP: 129/74  Pulse: 87  Weight: 223 lb 12.8 oz (101.5 kg)  Height: '5\' 11"'$  (1.803 m)   Body mass index is 31.21 kg/m. Wt Readings from Last 3 Encounters:  06/14/22 223 lb 12.8 oz (101.5 kg)  10/12/21 215 lb 12.8 oz (97.9 kg)  07/12/21 217 lb 6 oz (98.6 kg)   Patient is in no distress; well developed, nourished and groomed; neck is supple  CARDIOVASCULAR: Examination of carotid arteries is normal; no carotid bruits Regular rate and rhythm, no murmurs Examination of peripheral vascular system by observation and palpation is normal  EYES: Ophthalmoscopic exam of optic discs and posterior segments is normal; no papilledema or hemorrhages No results found.  MUSCULOSKELETAL: Gait, strength, tone, movements noted in Neurologic exam below  NEUROLOGIC: MENTAL STATUS:      No data to display         awake, alert, oriented to person, place and time recent and remote memory intact normal attention and concentration language fluent, comprehension intact, naming intact fund of knowledge appropriate  CRANIAL NERVE:  2nd - no papilledema on fundoscopic exam 2nd, 3rd, 4th, 6th - pupils equal and reactive to light, visual fields full to confrontation, extraocular muscles intact, no  nystagmus 5th - facial sensation symmetric 7th - facial strength symmetric 8th - hearing intact 9th - palate elevates symmetrically, uvula midline 11th - shoulder shrug --> SLIGHTLY DECR ON RIGHT SHOULDER 12th - tongue protrusion midline SOFT VOICE MASKED FACIES  MOTOR:  NO TREMOR  MILD COGWHEELING RIGIDITY IN RUE >> LUE MILD BRADYKINESIA IN RUE normal bulk and tone, full strength in the BUE, BLE  SENSORY:  normal and symmetric to light touch, temperature, vibration  COORDINATION:  finger-nose-finger, fine finger movements --. MILD DYSMETRIA  REFLEXES:  deep tendon reflexes 1+  and symmetric  GAIT/STATION:  narrow based gait; SLIGHT DECR ARM SWING ON RIGHT; GOOD STRIDE AND TURNING     DIAGNOSTIC DATA (LABS, IMAGING, TESTING) - I reviewed patient records, labs, notes, testing and imaging myself where available.  No results found for: "WBC", "HGB", "HCT", "MCV", "PLT" No results found for: "NA", "K", "CL", "CO2", "GLUCOSE", "BUN", "CREATININE", "CALCIUM", "PROT", "ALBUMIN", "AST", "ALT", "ALKPHOS", "BILITOT", "GFRNONAA", "GFRAA" No results found for: "CHOL", "HDL", "LDLCALC", "LDLDIRECT", "TRIG", "CHOLHDL" No results found for: "HGBA1C" No results found for: "VITAMINB12" No results found for: "TSH"    07/22/21 - Unremarkable MRI brain (with and without). No acute findings.    ASSESSMENT AND PLAN  71 y.o. year old female here with new onset resting tremor, cogwheel rigidity, masked facies, decreased arm swing, with signs and's symptoms suggestive of idiopathic Parkinson's disease.   Dx:  1. Parkinson's disease ([place])   2. Tremor       PLAN:   IDIOPATHIC PARKINSON'S DISEASE - continue carb/levo 25/100 --> 1.5 tabs three times a day with meals (good response) - continue exercise program  Meds ordered this encounter  Medications   carbidopa-levodopa (SINEMET IR) 25-100 MG tablet    Sig: Take 1.5 tablets by mouth 3 (three) times daily.    Dispense:  405  tablet    Refill:  4   Return in about 1 year (around 06/15/2023).    Penni Bombard, MD 08/04/1750, 02:58 AM Certified in Neurology, Neurophysiology and Neuroimaging  [facility] [address] [place], [place] [address] [phone]

## 2022-06-15 DIAGNOSIS — M25561 Pain in right knee: Secondary | ICD-10-CM | POA: Diagnosis not present

## 2022-06-15 DIAGNOSIS — M1711 Unilateral primary osteoarthritis, right knee: Secondary | ICD-10-CM | POA: Diagnosis not present

## 2022-06-22 DIAGNOSIS — M1711 Unilateral primary osteoarthritis, right knee: Secondary | ICD-10-CM | POA: Diagnosis not present

## 2022-06-22 DIAGNOSIS — M25561 Pain in right knee: Secondary | ICD-10-CM | POA: Diagnosis not present

## 2022-06-30 DIAGNOSIS — M25561 Pain in right knee: Secondary | ICD-10-CM | POA: Diagnosis not present

## 2022-06-30 DIAGNOSIS — M1711 Unilateral primary osteoarthritis, right knee: Secondary | ICD-10-CM | POA: Diagnosis not present

## 2022-07-06 DIAGNOSIS — M25561 Pain in right knee: Secondary | ICD-10-CM | POA: Diagnosis not present

## 2022-07-13 DIAGNOSIS — M25561 Pain in right knee: Secondary | ICD-10-CM | POA: Diagnosis not present

## 2022-07-13 DIAGNOSIS — M1711 Unilateral primary osteoarthritis, right knee: Secondary | ICD-10-CM | POA: Diagnosis not present

## 2022-07-20 DIAGNOSIS — M1711 Unilateral primary osteoarthritis, right knee: Secondary | ICD-10-CM | POA: Diagnosis not present

## 2022-07-20 DIAGNOSIS — M25561 Pain in right knee: Secondary | ICD-10-CM | POA: Diagnosis not present

## 2022-07-21 ENCOUNTER — Telehealth: Payer: Self-pay | Admitting: Diagnostic Neuroimaging

## 2022-07-21 NOTE — Telephone Encounter (Signed)
Called patient and advise d her per MD to monitor next 1-2 weeks. He may need to increase the carb/levo in future if she's still feeling slow.  Patient verbalized understanding, appreciation.

## 2022-07-21 NOTE — Telephone Encounter (Signed)
Pt said, have noticed in the last week have started moving slower. Would like a call from the nurse.

## 2022-07-21 NOTE — Telephone Encounter (Signed)
Called patient who stated in last week she's walking slower. She has knee issues, has been getting injections with gel every week. She got  last one was yesterday, received a total of 5. She stated she may have jumped the gun because her knee still hurts and she just finished injections. She denies any changes In her gait, the way she walks. I advised will let Dr Leta Baptist know and call her back if any instructions.  Otherwise she should monitor, give injections time to feel full effect. She can call us back if walking doesn't improve. Patient verbalized understanding, appreciation.

## 2022-07-25 DIAGNOSIS — M1711 Unilateral primary osteoarthritis, right knee: Secondary | ICD-10-CM | POA: Diagnosis not present

## 2022-07-25 DIAGNOSIS — M25561 Pain in right knee: Secondary | ICD-10-CM | POA: Diagnosis not present

## 2022-07-27 DIAGNOSIS — M25561 Pain in right knee: Secondary | ICD-10-CM | POA: Diagnosis not present

## 2022-09-09 DIAGNOSIS — M25561 Pain in right knee: Secondary | ICD-10-CM | POA: Diagnosis not present

## 2022-09-21 ENCOUNTER — Other Ambulatory Visit: Payer: Self-pay | Admitting: Nurse Practitioner

## 2022-09-21 DIAGNOSIS — D492 Neoplasm of unspecified behavior of bone, soft tissue, and skin: Secondary | ICD-10-CM | POA: Diagnosis not present

## 2022-09-21 DIAGNOSIS — Z1231 Encounter for screening mammogram for malignant neoplasm of breast: Secondary | ICD-10-CM

## 2022-09-21 DIAGNOSIS — L814 Other melanin hyperpigmentation: Secondary | ICD-10-CM | POA: Diagnosis not present

## 2022-09-21 DIAGNOSIS — L989 Disorder of the skin and subcutaneous tissue, unspecified: Secondary | ICD-10-CM | POA: Diagnosis not present

## 2022-09-21 DIAGNOSIS — D225 Melanocytic nevi of trunk: Secondary | ICD-10-CM | POA: Diagnosis not present

## 2022-10-20 DIAGNOSIS — M1712 Unilateral primary osteoarthritis, left knee: Secondary | ICD-10-CM | POA: Diagnosis not present

## 2022-10-20 DIAGNOSIS — M170 Bilateral primary osteoarthritis of knee: Secondary | ICD-10-CM | POA: Diagnosis not present

## 2022-10-20 DIAGNOSIS — M1711 Unilateral primary osteoarthritis, right knee: Secondary | ICD-10-CM | POA: Diagnosis not present

## 2022-11-01 ENCOUNTER — Ambulatory Visit
Admission: RE | Admit: 2022-11-01 | Discharge: 2022-11-01 | Disposition: A | Discharge status: Home / Self Care | Payer: Medicare Other | Source: Ambulatory Visit | Attending: Nurse Practitioner | Admitting: Nurse Practitioner

## 2022-11-01 DIAGNOSIS — Z1231 Encounter for screening mammogram for malignant neoplasm of breast: Secondary | ICD-10-CM

## 2022-11-01 DIAGNOSIS — M25561 Pain in right knee: Secondary | ICD-10-CM | POA: Diagnosis not present

## 2022-11-21 DIAGNOSIS — E559 Vitamin D deficiency, unspecified: Secondary | ICD-10-CM | POA: Diagnosis not present

## 2022-11-21 DIAGNOSIS — R5383 Other fatigue: Secondary | ICD-10-CM | POA: Diagnosis not present

## 2022-11-21 DIAGNOSIS — E785 Hyperlipidemia, unspecified: Secondary | ICD-10-CM | POA: Diagnosis not present

## 2022-11-21 DIAGNOSIS — G4700 Insomnia, unspecified: Secondary | ICD-10-CM | POA: Diagnosis not present

## 2022-11-21 DIAGNOSIS — R7303 Prediabetes: Secondary | ICD-10-CM | POA: Diagnosis not present

## 2022-11-21 DIAGNOSIS — I10 Essential (primary) hypertension: Secondary | ICD-10-CM | POA: Diagnosis not present

## 2022-11-21 DIAGNOSIS — Z6831 Body mass index (BMI) 31.0-31.9, adult: Secondary | ICD-10-CM | POA: Diagnosis not present

## 2022-12-01 DIAGNOSIS — M1712 Unilateral primary osteoarthritis, left knee: Secondary | ICD-10-CM | POA: Diagnosis not present

## 2022-12-06 DIAGNOSIS — R55 Syncope and collapse: Secondary | ICD-10-CM | POA: Diagnosis not present

## 2022-12-06 DIAGNOSIS — I10 Essential (primary) hypertension: Secondary | ICD-10-CM | POA: Diagnosis not present

## 2022-12-06 DIAGNOSIS — R9431 Abnormal electrocardiogram [ECG] [EKG]: Secondary | ICD-10-CM | POA: Diagnosis not present

## 2022-12-06 DIAGNOSIS — Z87891 Personal history of nicotine dependence: Secondary | ICD-10-CM | POA: Diagnosis not present

## 2022-12-06 DIAGNOSIS — I700 Atherosclerosis of aorta: Secondary | ICD-10-CM | POA: Diagnosis not present

## 2022-12-06 DIAGNOSIS — R079 Chest pain, unspecified: Secondary | ICD-10-CM | POA: Diagnosis not present

## 2022-12-06 DIAGNOSIS — R42 Dizziness and giddiness: Secondary | ICD-10-CM | POA: Diagnosis not present

## 2022-12-06 DIAGNOSIS — J986 Disorders of diaphragm: Secondary | ICD-10-CM | POA: Diagnosis not present

## 2022-12-08 DIAGNOSIS — M1712 Unilateral primary osteoarthritis, left knee: Secondary | ICD-10-CM | POA: Diagnosis not present

## 2022-12-08 DIAGNOSIS — Z6832 Body mass index (BMI) 32.0-32.9, adult: Secondary | ICD-10-CM | POA: Diagnosis not present

## 2022-12-08 DIAGNOSIS — F418 Other specified anxiety disorders: Secondary | ICD-10-CM | POA: Diagnosis not present

## 2022-12-15 DIAGNOSIS — M1712 Unilateral primary osteoarthritis, left knee: Secondary | ICD-10-CM | POA: Diagnosis not present

## 2023-01-05 DIAGNOSIS — M1711 Unilateral primary osteoarthritis, right knee: Secondary | ICD-10-CM | POA: Diagnosis not present

## 2023-01-09 DIAGNOSIS — Z6832 Body mass index (BMI) 32.0-32.9, adult: Secondary | ICD-10-CM | POA: Diagnosis not present

## 2023-01-09 DIAGNOSIS — F418 Other specified anxiety disorders: Secondary | ICD-10-CM | POA: Diagnosis not present

## 2023-01-09 DIAGNOSIS — G4700 Insomnia, unspecified: Secondary | ICD-10-CM | POA: Diagnosis not present

## 2023-03-02 DIAGNOSIS — M1711 Unilateral primary osteoarthritis, right knee: Secondary | ICD-10-CM | POA: Diagnosis not present

## 2023-03-20 DIAGNOSIS — M25561 Pain in right knee: Secondary | ICD-10-CM | POA: Diagnosis not present

## 2023-04-06 DIAGNOSIS — M1711 Unilateral primary osteoarthritis, right knee: Secondary | ICD-10-CM | POA: Diagnosis not present

## 2023-04-11 DIAGNOSIS — E559 Vitamin D deficiency, unspecified: Secondary | ICD-10-CM | POA: Diagnosis not present

## 2023-04-11 DIAGNOSIS — Z01818 Encounter for other preprocedural examination: Secondary | ICD-10-CM | POA: Diagnosis not present

## 2023-04-11 DIAGNOSIS — R7301 Impaired fasting glucose: Secondary | ICD-10-CM | POA: Diagnosis not present

## 2023-04-11 DIAGNOSIS — G4700 Insomnia, unspecified: Secondary | ICD-10-CM | POA: Diagnosis not present

## 2023-04-11 DIAGNOSIS — G20C Parkinsonism, unspecified: Secondary | ICD-10-CM | POA: Diagnosis not present

## 2023-04-11 DIAGNOSIS — M25561 Pain in right knee: Secondary | ICD-10-CM | POA: Diagnosis not present

## 2023-04-11 DIAGNOSIS — I10 Essential (primary) hypertension: Secondary | ICD-10-CM | POA: Diagnosis not present

## 2023-04-11 DIAGNOSIS — F418 Other specified anxiety disorders: Secondary | ICD-10-CM | POA: Diagnosis not present

## 2023-04-26 HISTORY — PX: REPLACEMENT TOTAL KNEE: SUR1224

## 2023-05-02 DIAGNOSIS — G8918 Other acute postprocedural pain: Secondary | ICD-10-CM | POA: Diagnosis not present

## 2023-05-02 DIAGNOSIS — M1711 Unilateral primary osteoarthritis, right knee: Secondary | ICD-10-CM | POA: Diagnosis not present

## 2023-05-04 DIAGNOSIS — M25561 Pain in right knee: Secondary | ICD-10-CM | POA: Diagnosis not present

## 2023-05-05 DIAGNOSIS — Z96651 Presence of right artificial knee joint: Secondary | ICD-10-CM | POA: Diagnosis not present

## 2023-05-05 DIAGNOSIS — Z471 Aftercare following joint replacement surgery: Secondary | ICD-10-CM | POA: Diagnosis not present

## 2023-05-08 DIAGNOSIS — M25561 Pain in right knee: Secondary | ICD-10-CM | POA: Diagnosis not present

## 2023-05-10 DIAGNOSIS — Z6834 Body mass index (BMI) 34.0-34.9, adult: Secondary | ICD-10-CM | POA: Diagnosis not present

## 2023-05-10 DIAGNOSIS — I10 Essential (primary) hypertension: Secondary | ICD-10-CM | POA: Diagnosis not present

## 2023-05-10 DIAGNOSIS — R5383 Other fatigue: Secondary | ICD-10-CM | POA: Diagnosis not present

## 2023-05-15 DIAGNOSIS — M25561 Pain in right knee: Secondary | ICD-10-CM | POA: Diagnosis not present

## 2023-05-17 DIAGNOSIS — M25561 Pain in right knee: Secondary | ICD-10-CM | POA: Diagnosis not present

## 2023-05-17 DIAGNOSIS — Z4731 Aftercare following explantation of shoulder joint prosthesis: Secondary | ICD-10-CM | POA: Diagnosis not present

## 2023-05-23 DIAGNOSIS — Z9181 History of falling: Secondary | ICD-10-CM | POA: Diagnosis not present

## 2023-05-23 DIAGNOSIS — I10 Essential (primary) hypertension: Secondary | ICD-10-CM | POA: Diagnosis not present

## 2023-05-23 DIAGNOSIS — E538 Deficiency of other specified B group vitamins: Secondary | ICD-10-CM | POA: Diagnosis not present

## 2023-05-23 DIAGNOSIS — Z6833 Body mass index (BMI) 33.0-33.9, adult: Secondary | ICD-10-CM | POA: Diagnosis not present

## 2023-05-23 DIAGNOSIS — G20C Parkinsonism, unspecified: Secondary | ICD-10-CM | POA: Diagnosis not present

## 2023-05-23 DIAGNOSIS — Z139 Encounter for screening, unspecified: Secondary | ICD-10-CM | POA: Diagnosis not present

## 2023-05-23 DIAGNOSIS — M25561 Pain in right knee: Secondary | ICD-10-CM | POA: Diagnosis not present

## 2023-05-23 DIAGNOSIS — Z1331 Encounter for screening for depression: Secondary | ICD-10-CM | POA: Diagnosis not present

## 2023-05-23 DIAGNOSIS — F418 Other specified anxiety disorders: Secondary | ICD-10-CM | POA: Diagnosis not present

## 2023-05-23 DIAGNOSIS — E785 Hyperlipidemia, unspecified: Secondary | ICD-10-CM | POA: Diagnosis not present

## 2023-05-24 DIAGNOSIS — M25561 Pain in right knee: Secondary | ICD-10-CM | POA: Diagnosis not present

## 2023-05-29 DIAGNOSIS — M25561 Pain in right knee: Secondary | ICD-10-CM | POA: Diagnosis not present

## 2023-05-31 ENCOUNTER — Telehealth: Payer: Self-pay | Admitting: Diagnostic Neuroimaging

## 2023-05-31 DIAGNOSIS — M25561 Pain in right knee: Secondary | ICD-10-CM | POA: Diagnosis not present

## 2023-05-31 NOTE — Telephone Encounter (Signed)
Pt stated her blood pressure is running low. Stated she think it may be coming from the carbidopa-levodopa (SINEMET IR) 25-100 MG tablet. Stated today her blood pressure was (90/63), stated she has not energy at all.

## 2023-06-05 DIAGNOSIS — M25561 Pain in right knee: Secondary | ICD-10-CM | POA: Diagnosis not present

## 2023-06-07 DIAGNOSIS — M25561 Pain in right knee: Secondary | ICD-10-CM | POA: Diagnosis not present

## 2023-06-12 DIAGNOSIS — M25561 Pain in right knee: Secondary | ICD-10-CM | POA: Diagnosis not present

## 2023-06-13 DIAGNOSIS — M1711 Unilateral primary osteoarthritis, right knee: Secondary | ICD-10-CM | POA: Diagnosis not present

## 2023-06-13 DIAGNOSIS — Z5189 Encounter for other specified aftercare: Secondary | ICD-10-CM | POA: Diagnosis not present

## 2023-06-19 ENCOUNTER — Encounter: Payer: Self-pay | Admitting: Diagnostic Neuroimaging

## 2023-06-19 ENCOUNTER — Ambulatory Visit (INDEPENDENT_AMBULATORY_CARE_PROVIDER_SITE_OTHER): Payer: Medicare Other | Admitting: Diagnostic Neuroimaging

## 2023-06-19 VITALS — BP 124/78 | HR 88 | Ht 71.0 in | Wt 218.8 lb

## 2023-06-19 DIAGNOSIS — G20A1 Parkinson's disease without dyskinesia, without mention of fluctuations: Secondary | ICD-10-CM | POA: Diagnosis not present

## 2023-06-19 MED ORDER — CARBIDOPA-LEVODOPA 25-100 MG PO TABS: 2.0000 | ORAL_TABLET | Freq: Three times a day (TID) | ORAL | 4 refills | Status: DC

## 2023-06-19 NOTE — Progress Notes (Signed)
[facility]  PATIENT: Christine Hayes DOB: [birth year]  REFERRING CLINICIAN: Guadalupe Maple., MD HISTORY FROM: patient  REASON FOR VISIT: follow up  HISTORICAL  CHIEF COMPLAINT:  Chief Complaint  Patient presents with   Follow-up   Tremors    Rm 7, here alone  1 year follow up on Parkinson's, c/o: low energy and has been moving slower.      HISTORY OF PRESENT ILLNESS:   72 year old female here (06/19/23, VRP): Since last visit, doing well. Symptoms are stable. Some addl fatigue and slow movements, but overall doing well. Some issues with low BP after her right knee surgery in May 2024.   UPDATE (06/14/22, VRP): Since last visit, doing WELL. Symptoms are mild. Tolerating carb/levo 1.5 tabs three times a day now.     UPDATE (10/12/21, VRP): Since last visit, doing well. Symptoms are improved on carb /levo 1 tab three times a day (better movements, less tremor). No on - off issues. Gait stable. No side effects.   PRIOR HPI (07/12/21): 72 year old female here for evaluation of right hand tremor.  Symptoms started in December 2021.  She noted intermittent brief tremor in her right hand and fingers when she is using a pen and writing cursive, sometimes during anxiety, sometimes when she feels cold.  No gait or balance difficulties.  No change in smell or taste.  No sleep issues or acting out dreams.  Family history of tremor in her mother but diagnosis was not clear.   REVIEW OF SYSTEMS: Full 14 system review of systems performed and negative with exception of: as per HPI.  ALLERGIES: No Known Allergies  HOME MEDICATIONS: Outpatient Medications Prior to Visit  Medication Sig Dispense Refill   ALPRAZolam (XANAX) 0.5 MG tablet for sedation before MRI scan; take 1 tab 1 hour before scan; may repeat 1 tab 15 min before scan 3 tablet 0   aspirin EC 81 MG tablet Take 81 mg by mouth daily.     Cholecalciferol (VITAMIN D3) 1.25 MG (50000 UT) CAPS Take by mouth 3 (three) times daily.      diazepam (VALIUM) 5 MG tablet diazepam 5 mg tablet  TAKE ONE TABLET BY MOUTH 1 HOUR PRIOR TO MRI. REPEAT AS NEEDED. DRIVER NEEDED.     olmesartan (BENICAR) 20 MG tablet Take 10 mg by mouth daily.     psyllium (METAMUCIL) 58.6 % packet Take 1 packet by mouth as needed.     rosuvastatin (CRESTOR) 40 MG tablet Take 40 mg by mouth daily.     carbidopa-levodopa (SINEMET IR) 25-100 MG tablet Take 1.5 tablets by mouth 3 (three) times daily. 405 tablet 4   fluocinonide cream (LIDEX) 0.05 % APPLY TO AFFECTED AREA TWICE A DAY     Sodium Sulfate-Mag Sulfate-KCl (SUTAB) [phone] MG TABS Sutab 1.479-0.188-0.225 gram tablet     No facility-administered medications prior to visit.    PAST MEDICAL HISTORY: Past Medical History:  Diagnosis Date   Anxiety    Breast cancer ([facility]) 1995   left breast cancer   HTN (hypertension)    Hyperlipidemia    Hypertension    Insomnia    Parkinson's disease    Personal history of chemotherapy 1995   Personal history of radiation therapy 1995   Vitamin D deficiency disease     PAST SURGICAL HISTORY: Past Surgical History:  Procedure Laterality Date   BREAST BIOPSY Left 1995   malignant   BREAST LUMPECTOMY Left 1995   BREAST RECONSTRUCTION  2002   left  and breast reduction right breast   COLONOSCOPY  07/12/2017   [facility]   DILATION AND CURETTAGE OF UTERUS  1985   POLYPECTOMY     REDUCTION MAMMAPLASTY Bilateral 2002   REPLACEMENT TOTAL KNEE Right 04/2023    FAMILY HISTORY: Family History  Problem Relation Age of Onset   Hypertension Mother    Colon polyps Father    Dementia Father    Hyperlipidemia Brother    Colon cancer Neg Hx    Stomach cancer Neg Hx    Esophageal cancer Neg Hx    Rectal cancer Neg Hx     SOCIAL HISTORY: Social History   Socioeconomic History   Marital status: Married    Spouse name: Windy Fast   Number of children: Not on file   Years of education: Not on file   Highest education level: Not on file  Occupational  History   Not on file  Tobacco Use   Smoking status: Former    Types: Cigarettes    Quit date: 05/11/1979    Years since quitting: 44.1   Smokeless tobacco: Never  Vaping Use   Vaping Use: Never used  Substance and Sexual Activity   Alcohol use: Yes    Alcohol/week: 2.0 standard drinks of alcohol    Types: 2 Glasses of wine per week   Drug use: No   Sexual activity: Not on file  Other Topics Concern   Not on file  Social History Narrative   Right handed    Caffeine - Occasional use a few times per month   Lives at home with her husband.    MB< RN 07/12/21   Social Determinants of Health   Financial Resource Strain: Not on file  Food Insecurity: Not on file  Transportation Needs: Not on file  Physical Activity: Not on file  Stress: Not on file  Social Connections: Not on file  Intimate Partner Violence: Not on file     PHYSICAL EXAM  GENERAL EXAM/CONSTITUTIONAL: Vitals:  Vitals:   06/19/23 1522  BP: 124/78  Pulse: 88  Weight: 218 lb 12.8 oz (99.2 kg)  Height: 5\' 11"  (1.803 m)   Body mass index is 30.52 kg/m. Wt Readings from Last 3 Encounters:  06/19/23 218 lb 12.8 oz (99.2 kg)  06/14/22 223 lb 12.8 oz (101.5 kg)  10/12/21 215 lb 12.8 oz (97.9 kg)   Patient is in no distress; well developed, nourished and groomed; neck is supple  CARDIOVASCULAR: Examination of carotid arteries is normal; no carotid bruits Regular rate and rhythm, no murmurs Examination of peripheral vascular system by observation and palpation is normal  EYES: Ophthalmoscopic exam of optic discs and posterior segments is normal; no papilledema or hemorrhages No results found.  MUSCULOSKELETAL: Gait, strength, tone, movements noted in Neurologic exam below  NEUROLOGIC: MENTAL STATUS:      No data to display         awake, alert, oriented to person, place and time recent and remote memory intact normal attention and concentration language fluent, comprehension intact, naming  intact fund of knowledge appropriate  CRANIAL NERVE:  2nd - no papilledema on fundoscopic exam 2nd, 3rd, 4th, 6th - pupils equal and reactive to light, visual fields full to confrontation, extraocular muscles intact, no nystagmus 5th - facial sensation symmetric 7th - facial strength symmetric 8th - hearing intact 9th - palate elevates symmetrically, uvula midline 11th - shoulder shrug --> SLIGHTLY DECR ON RIGHT SHOULDER 12th - tongue protrusion midline SOFT VOICE MASKED FACIES  MOTOR:  NO TREMOR  MILD COGWHEELING RIGIDITY IN RUE > LUE NO BRADYKINESIA  normal bulk and tone, full strength in the BUE, BLE  SENSORY:  normal and symmetric to light touch, temperature, vibration  COORDINATION:  finger-nose-finger, fine finger movements --. MILD DYSMETRIA  REFLEXES:  deep tendon reflexes 1+  and symmetric  GAIT/STATION:  narrow based gait; SLIGHT DECR ARM SWING ON RIGHT; GOOD STRIDE AND TURNING     DIAGNOSTIC DATA (LABS, IMAGING, TESTING) - I reviewed patient records, labs, notes, testing and imaging myself where available.  No results found for: "WBC", "HGB", "HCT", "MCV", "PLT" No results found for: "NA", "K", "CL", "CO2", "GLUCOSE", "BUN", "CREATININE", "CALCIUM", "PROT", "ALBUMIN", "AST", "ALT", "ALKPHOS", "BILITOT", "GFRNONAA", "GFRAA" No results found for: "CHOL", "HDL", "LDLCALC", "LDLDIRECT", "TRIG", "CHOLHDL" No results found for: "HGBA1C" No results found for: "VITAMINB12" No results found for: "TSH"    07/22/21 - Unremarkable MRI brain (with and without). No acute findings.    ASSESSMENT AND PLAN  72 y.o. year old female here with new onset resting tremor, cogwheel rigidity, masked facies, decreased arm swing, with signs and's symptoms suggestive of idiopathic Parkinson's disease.   Dx:  1. Parkinson's disease without dyskinesia or fluctuating manifestations      PLAN:  IDIOPATHIC PARKINSON'S DISEASE (since ~2021) - continue carb/levo 25/100 -->  increase to 2 tabs three times a day with meals (good response) - continue exercise program  Meds ordered this encounter  Medications   carbidopa-levodopa (SINEMET IR) 25-100 MG tablet    Sig: Take 2 tablets by mouth 3 (three) times daily.    Dispense:  540 tablet    Refill:  4   Return in about 1 year (around 06/18/2024).    Suanne Marker, MD 06/19/2023, 3:48 PM Certified in Neurology, Neurophysiology and Neuroimaging  [facility] [address], [address] [place], [place] [address] [phone]

## 2023-06-21 DIAGNOSIS — M25561 Pain in right knee: Secondary | ICD-10-CM | POA: Diagnosis not present

## 2023-06-25 DIAGNOSIS — H1132 Conjunctival hemorrhage, left eye: Secondary | ICD-10-CM | POA: Diagnosis not present

## 2023-06-28 DIAGNOSIS — M25561 Pain in right knee: Secondary | ICD-10-CM | POA: Diagnosis not present

## 2023-07-05 DIAGNOSIS — M25561 Pain in right knee: Secondary | ICD-10-CM | POA: Diagnosis not present

## 2023-07-10 ENCOUNTER — Other Ambulatory Visit: Payer: Self-pay | Admitting: Diagnostic Neuroimaging

## 2023-07-13 DIAGNOSIS — M1712 Unilateral primary osteoarthritis, left knee: Secondary | ICD-10-CM | POA: Diagnosis not present

## 2023-07-19 DIAGNOSIS — M1712 Unilateral primary osteoarthritis, left knee: Secondary | ICD-10-CM | POA: Diagnosis not present

## 2023-07-26 DIAGNOSIS — M1712 Unilateral primary osteoarthritis, left knee: Secondary | ICD-10-CM | POA: Diagnosis not present

## 2023-08-29 DIAGNOSIS — Z6833 Body mass index (BMI) 33.0-33.9, adult: Secondary | ICD-10-CM | POA: Diagnosis not present

## 2023-08-29 DIAGNOSIS — E785 Hyperlipidemia, unspecified: Secondary | ICD-10-CM | POA: Diagnosis not present

## 2023-08-29 DIAGNOSIS — F418 Other specified anxiety disorders: Secondary | ICD-10-CM | POA: Diagnosis not present

## 2023-08-29 DIAGNOSIS — E559 Vitamin D deficiency, unspecified: Secondary | ICD-10-CM | POA: Diagnosis not present

## 2023-08-29 DIAGNOSIS — Z1231 Encounter for screening mammogram for malignant neoplasm of breast: Secondary | ICD-10-CM | POA: Diagnosis not present

## 2023-08-29 DIAGNOSIS — G20C Parkinsonism, unspecified: Secondary | ICD-10-CM | POA: Diagnosis not present

## 2023-08-29 DIAGNOSIS — I10 Essential (primary) hypertension: Secondary | ICD-10-CM | POA: Diagnosis not present

## 2023-08-30 ENCOUNTER — Other Ambulatory Visit: Payer: Self-pay | Admitting: Nurse Practitioner

## 2023-08-30 DIAGNOSIS — Z1231 Encounter for screening mammogram for malignant neoplasm of breast: Secondary | ICD-10-CM

## 2023-09-15 DIAGNOSIS — M1712 Unilateral primary osteoarthritis, left knee: Secondary | ICD-10-CM | POA: Diagnosis not present

## 2023-09-15 DIAGNOSIS — Z96651 Presence of right artificial knee joint: Secondary | ICD-10-CM | POA: Diagnosis not present

## 2023-11-07 ENCOUNTER — Ambulatory Visit
Admission: RE | Admit: 2023-11-07 | Discharge: 2023-11-07 | Disposition: A | Discharge status: Home / Self Care | Payer: Medicare Other | Source: Ambulatory Visit | Attending: Nurse Practitioner | Admitting: Nurse Practitioner

## 2023-11-07 DIAGNOSIS — Z1231 Encounter for screening mammogram for malignant neoplasm of breast: Secondary | ICD-10-CM

## 2023-11-10 DIAGNOSIS — M1712 Unilateral primary osteoarthritis, left knee: Secondary | ICD-10-CM | POA: Diagnosis not present

## 2023-11-10 DIAGNOSIS — M25662 Stiffness of left knee, not elsewhere classified: Secondary | ICD-10-CM | POA: Diagnosis not present

## 2023-11-10 DIAGNOSIS — R262 Difficulty in walking, not elsewhere classified: Secondary | ICD-10-CM | POA: Diagnosis not present

## 2023-11-10 DIAGNOSIS — M25562 Pain in left knee: Secondary | ICD-10-CM | POA: Diagnosis not present

## 2024-01-11 DIAGNOSIS — M1712 Unilateral primary osteoarthritis, left knee: Secondary | ICD-10-CM | POA: Diagnosis not present

## 2024-01-11 DIAGNOSIS — Z96651 Presence of right artificial knee joint: Secondary | ICD-10-CM | POA: Diagnosis not present

## 2024-02-27 DIAGNOSIS — M1712 Unilateral primary osteoarthritis, left knee: Secondary | ICD-10-CM | POA: Diagnosis not present

## 2024-02-27 DIAGNOSIS — Z96651 Presence of right artificial knee joint: Secondary | ICD-10-CM | POA: Diagnosis not present

## 2024-03-06 DIAGNOSIS — Z9181 History of falling: Secondary | ICD-10-CM | POA: Diagnosis not present

## 2024-03-06 DIAGNOSIS — Z0000 Encounter for general adult medical examination without abnormal findings: Secondary | ICD-10-CM | POA: Diagnosis not present

## 2024-03-14 DIAGNOSIS — M25561 Pain in right knee: Secondary | ICD-10-CM | POA: Diagnosis not present

## 2024-03-19 DIAGNOSIS — E559 Vitamin D deficiency, unspecified: Secondary | ICD-10-CM | POA: Diagnosis not present

## 2024-03-19 DIAGNOSIS — E785 Hyperlipidemia, unspecified: Secondary | ICD-10-CM | POA: Diagnosis not present

## 2024-03-19 DIAGNOSIS — I10 Essential (primary) hypertension: Secondary | ICD-10-CM | POA: Diagnosis not present

## 2024-03-19 DIAGNOSIS — F418 Other specified anxiety disorders: Secondary | ICD-10-CM | POA: Diagnosis not present

## 2024-03-19 DIAGNOSIS — G20C Parkinsonism, unspecified: Secondary | ICD-10-CM | POA: Diagnosis not present

## 2024-03-19 DIAGNOSIS — E538 Deficiency of other specified B group vitamins: Secondary | ICD-10-CM | POA: Diagnosis not present

## 2024-03-19 DIAGNOSIS — Z79899 Other long term (current) drug therapy: Secondary | ICD-10-CM | POA: Diagnosis not present

## 2024-03-21 ENCOUNTER — Telehealth: Payer: Self-pay | Admitting: Diagnostic Neuroimaging

## 2024-03-21 ENCOUNTER — Other Ambulatory Visit: Payer: Self-pay

## 2024-03-21 NOTE — Telephone Encounter (Signed)
 Pt said having no energy,. Saw  PCP Tuesday, got results yesterday; everything was normal. PCP did decrease blood pressure because blood pressure was low. Dr. Marjory Lies said to call if had any problems before appt in June. Would like a call back.

## 2024-03-21 NOTE — Telephone Encounter (Signed)
 Spoke w/Pt regarding concerns of no energy. Pt stated at recent PCP visit (3/25), BP med (olmesartan) reduced to 5mg  once daily and PCP gave B-12 injection. Pt stated she has a f/u PCP visit in 3 weeks for re-check. Pt stated she wanted to check to be sure there was not something she needed from a neurology perspective. Discussed w/Pt at this time to wait for the PCP f/u visit and if she continues to have issues that are not addressed by PCP, they can be discussed at her June appt w/Dr. Marjory Lies, or she can call back if she has further concerns before that appt. Pt stated understanding and thankful for the call.

## 2024-03-25 DIAGNOSIS — M25561 Pain in right knee: Secondary | ICD-10-CM | POA: Diagnosis not present

## 2024-03-29 ENCOUNTER — Other Ambulatory Visit: Payer: Self-pay

## 2024-03-29 ENCOUNTER — Encounter (HOSPITAL_COMMUNITY): Payer: Self-pay

## 2024-03-29 ENCOUNTER — Emergency Department (HOSPITAL_COMMUNITY)
Admission: EM | Admit: 2024-03-29 | Discharge: 2024-03-29 | Disposition: A | Discharge status: Home / Self Care | Attending: Emergency Medicine | Admitting: Emergency Medicine

## 2024-03-29 DIAGNOSIS — I10 Essential (primary) hypertension: Secondary | ICD-10-CM | POA: Insufficient documentation

## 2024-03-29 DIAGNOSIS — I959 Hypotension, unspecified: Secondary | ICD-10-CM | POA: Diagnosis not present

## 2024-03-29 DIAGNOSIS — N3000 Acute cystitis without hematuria: Secondary | ICD-10-CM | POA: Diagnosis not present

## 2024-03-29 DIAGNOSIS — G20A1 Parkinson's disease without dyskinesia, without mention of fluctuations: Secondary | ICD-10-CM | POA: Insufficient documentation

## 2024-03-29 DIAGNOSIS — I952 Hypotension due to drugs: Secondary | ICD-10-CM

## 2024-03-29 DIAGNOSIS — Z79899 Other long term (current) drug therapy: Secondary | ICD-10-CM | POA: Diagnosis not present

## 2024-03-29 DIAGNOSIS — Z7982 Long term (current) use of aspirin: Secondary | ICD-10-CM | POA: Insufficient documentation

## 2024-03-29 DIAGNOSIS — R519 Headache, unspecified: Secondary | ICD-10-CM | POA: Diagnosis not present

## 2024-03-29 LAB — BASIC METABOLIC PANEL WITH GFR
Anion gap: 12 (ref 5–15)
BUN: 13 mg/dL (ref 8–23)
CO2: 20 mmol/L — ABNORMAL LOW (ref 22–32)
Calcium: 9.6 mg/dL (ref 8.9–10.3)
Chloride: 108 mmol/L (ref 98–111)
Creatinine, Ser: 0.8 mg/dL (ref 0.44–1.00)
GFR, Estimated: >60 mL/min (ref >60)
Glucose, Bld: 129 mg/dL — ABNORMAL HIGH (ref 70–99)
Potassium: 3.7 mmol/L (ref 3.5–5.1)
Sodium: 140 mmol/L (ref 135–145)

## 2024-03-29 LAB — URINALYSIS, ROUTINE W REFLEX MICROSCOPIC
Bilirubin Urine: NEGATIVE
Glucose, UA: NEGATIVE mg/dL
Hgb urine dipstick: NEGATIVE
Ketones, ur: NEGATIVE mg/dL
Nitrite: NEGATIVE
Protein, ur: NEGATIVE mg/dL
Specific Gravity, Urine: 1.02 (ref 1.005–1.030)
pH: 5 (ref 5.0–8.0)

## 2024-03-29 LAB — WET PREP, GENITAL
Clue Cells Wet Prep HPF POC: NONE SEEN
Sperm: NONE SEEN
Trich, Wet Prep: NONE SEEN
WBC, Wet Prep HPF POC: <10 (ref <10)
Yeast Wet Prep HPF POC: NONE SEEN

## 2024-03-29 LAB — CBC
HCT: 42.7 % (ref 36.0–46.0)
Hemoglobin: 13.8 g/dL (ref 12.0–15.0)
MCH: 30.3 pg (ref 26.0–34.0)
MCHC: 32.3 g/dL (ref 30.0–36.0)
MCV: 93.6 fL (ref 80.0–100.0)
Platelets: 361 10*3/uL (ref 150–400)
RBC: 4.56 MIL/uL (ref 3.87–5.11)
RDW: 13.1 % (ref 11.5–15.5)
WBC: 8 10*3/uL (ref 4.0–10.5)
nRBC: 0 % (ref 0.0–0.2)

## 2024-03-29 MED ORDER — ACETAMINOPHEN 500 MG PO TABS
1000.0000 mg | ORAL_TABLET | Freq: Once | ORAL | Status: AC
  Administered 2024-03-29: 1000 mg via ORAL
  Filled 2024-03-29: qty 2

## 2024-03-29 MED ORDER — CEPHALEXIN 500 MG PO CAPS: 500.0000 mg | ORAL_CAPSULE | Freq: Two times a day (BID) | ORAL | 0 refills | Status: AC

## 2024-03-29 MED ORDER — SODIUM CHLORIDE 0.9 % IV BOLUS
500.0000 mL | Freq: Once | INTRAVENOUS | Status: AC
  Administered 2024-03-29: 500 mL via INTRAVENOUS

## 2024-03-29 MED ORDER — CEPHALEXIN 250 MG PO CAPS
500.0000 mg | ORAL_CAPSULE | Freq: Once | ORAL | Status: AC
  Administered 2024-03-29: 500 mg via ORAL
  Filled 2024-03-29: qty 2

## 2024-03-29 NOTE — Discharge Instructions (Addendum)
 It was a pleasure caring for you today. Please follow up with your primary care provider. I have sent your UTI antibiotics to your pharmacy - you may also find further topical relief with over-the-counter Vagisil. Seek emergency care if experiencing any new or worsening symptoms.  Alternating between 650 mg Tylenol and 400 mg Advil: The best way to alternate taking Acetaminophen (example Tylenol) and Ibuprofen (example Advil/Motrin) is to take them 3 hours apart. For example, if you take ibuprofen at 6 am you can then take Tylenol at 9 am. You can continue this regimen throughout the day, making sure you do not exceed the recommended maximum dose for each drug.

## 2024-03-29 NOTE — ED Provider Notes (Signed)
 [place] EMERGENCY DEPARTMENT AT [facility] Provider Note   CSN: [id number] Arrival date & time: 03/29/24  1110     History  Chief Complaint  Patient presents with   Headache   Hypotension    Christine Hayes is a 73 y.o. female with PMHx anxiety, HTN, HLD, insomnia, parkinson's disease who presents to ED with multiple complaints.  Headache:  Started today. Patient stating that she does not usually get headache. Denies head trauma or LOC. Endorses short episode of blurry vision and dizziness while riding in the car on the way to the ED. Headache is frontal. Patient has not taken any medication for the headache. Patient stating that she has difficulty ambulating given OA in BL knees.  Vaginal burning/erythema: Started today. Denies vaginal discharge or bleeding. Denies dysuria or hematuria. Declines STD workup today.  Hypotension: Intermittent problem x1 year. PCP working with patient and has been slowly down titrating her BP medication. Patient now only taking 5mg  Olmesartan. Did take a dose this morning. Also with intermittent fatigue over the past week.  Denies fever, chest pain, dyspnea, cough, nausea, vomiting, diarrhea, dysuria, hematuria, hematochezia.    Headache      Home Medications Prior to Admission medications   Medication Sig Start Date End Date Taking? Authorizing Provider  cephALEXin (KEFLEX) 500 MG capsule Take 1 capsule (500 mg total) by mouth 2 (two) times daily for 7 days. 03/29/24 04/05/24 Yes Laneya Gasaway, Charlotte Sanes F, PA-C  ALPRAZolam Prudy Feeler) 0.5 MG tablet for sedation before MRI scan; take 1 tab 1 hour before scan; may repeat 1 tab 15 min before scan 07/12/21   Penumalli, Glenford Bayley, MD  aspirin EC 81 MG tablet Take 81 mg by mouth daily.    [provider]  carbidopa-levodopa (SINEMET IR) 25-100 MG tablet Take 2 tablets by mouth 3 (three) times daily. 06/19/23 09/11/24  Penumalli, Glenford Bayley, MD  Cholecalciferol (VITAMIN D3) 1.25 MG (50000 UT)  CAPS Take by mouth 3 (three) times daily.    [provider]  diazepam (VALIUM) 5 MG tablet diazepam 5 mg tablet  TAKE ONE TABLET BY MOUTH 1 HOUR PRIOR TO MRI. REPEAT AS NEEDED. DRIVER NEEDED.    [provider]  olmesartan (BENICAR) 20 MG tablet Take 5 mg by mouth daily. 07/19/21   [provider]  psyllium (METAMUCIL) 58.6 % packet Take 1 packet by mouth as needed.    [provider]  rosuvastatin (CRESTOR) 40 MG tablet Take 40 mg by mouth daily. 03/03/20   [provider]      Allergies    Patient has no known allergies.    Review of Systems   Review of Systems  Neurological:  Positive for headaches.    Physical Exam Updated Vital Signs BP 120/71   Pulse 73   Temp 98.1 F (36.7 C)   Resp 10   Ht 5\' 11"  (1.803 m)   Wt 98 kg   SpO2 97%   BMI 30.13 kg/m  Physical Exam Vitals and nursing note reviewed.  Constitutional:      General: She is not in acute distress.    Appearance: She is not ill-appearing or toxic-appearing.  HENT:     Head: Normocephalic and atraumatic.     Comments: No temporal tenderness to palpation    Mouth/Throat:     Mouth: Mucous membranes are moist.     Pharynx: No oropharyngeal exudate or posterior oropharyngeal erythema.  Eyes:     General: No scleral icterus.  Right eye: No discharge.        Left eye: No discharge.     Conjunctiva/sclera: Conjunctivae normal.  Cardiovascular:     Rate and Rhythm: Normal rate and regular rhythm.     Pulses: Normal pulses.     Heart sounds: Normal heart sounds. No murmur heard. Pulmonary:     Effort: Pulmonary effort is normal. No respiratory distress.     Breath sounds: No wheezing, rhonchi or rales.  Abdominal:     Tenderness: There is no abdominal tenderness.  Genitourinary:    Comments: Chaperone: RN Chaplin 2cm area of mild erythema of BL labia majora. No lesions or discharge appreciated.  Musculoskeletal:     Right lower leg: No edema.     Left lower  leg: No edema.  Skin:    General: Skin is warm and dry.     Findings: No rash.  Neurological:     General: No focal deficit present.     Mental Status: She is alert. Mental status is at baseline.     Comments: GCS 15. Speech is goal oriented. No deficits appreciated to CN III-XII; symmetric eyebrow raise, no facial drooping, tongue midline. Patient has equal grip strength bilaterally with 5/5 strength against resistance in all major muscle groups bilaterally. Sensation to light touch intact. Patient moves extremities without ataxia.   Psychiatric:        Mood and Affect: Mood normal.     ED Results / Procedures / Treatments   Labs (all labs ordered are listed, but only abnormal results are displayed) Labs Reviewed  BASIC METABOLIC PANEL WITH GFR - Abnormal; Notable for the following components:      Result Value   CO2 20 (*)    Glucose, Bld 129 (*)    All other components within normal limits  URINALYSIS, ROUTINE W REFLEX MICROSCOPIC - Abnormal; Notable for the following components:   APPearance HAZY (*)    Leukocytes,Ua LARGE (*)    Bacteria, UA RARE (*)    All other components within normal limits  WET PREP, GENITAL  URINE CULTURE  CBC    EKG None  Radiology No results found.  Procedures Procedures    Medications Ordered in ED Medications  acetaminophen (TYLENOL) tablet 1,000 mg (1,000 mg Oral Given 03/29/24 1315)  sodium chloride 0.9 % bolus 500 mL (0 mLs Intravenous Stopped 03/29/24 1416)  cephALEXin (KEFLEX) capsule 500 mg (500 mg Oral Given 03/29/24 1350)    ED Course/ Medical Decision Making/ A&P                                 Medical Decision Making Amount and/or Complexity of Data Reviewed Labs: ordered.   This patient presents to the ED for concern of headache among other complaints, this involves an extensive number of treatment options, and is a complaint that carries with it a high risk of complications and morbidity.  The differential diagnosis  includes migraine, tension headache, cluster headache, subarachnoid hemorrhage, meningitis/encephalitis,  giant cell arteritis, ischemic stroke, ICH, cervical artery dissection   Co morbidities that complicate the patient evaluation  anxiety, HTN, HLD, insomnia, parkinson's disease   Additional history obtained:  Dr. Windle Guard PCP   Problem List / ED Course / Critical interventions / Medication management  Patient presents to ED with multiple complaints such as headache, hypotension, vaginal burning, short episode of dizziness/blurred vision. Physical exam with mild erythema of labia majora BL -  rest of physical exam unremarkable. Patient afebrile with stable vitals. I Ordered, and personally interpreted labs.  BMP reassuring.  Wet prep negative.  CBC without leukocytosis or anemia.  UA with large leukocytes, rare bacteria, 11-20 WBC. As for patient's hypotension - this seems to be chronic and d/t HTN medication issues. Educated patient to hold BP medications unless BP is over 160's systolic and to call PCP upon discharge for ASAP follow up appointment. Provided patient with IV fluids which resolved her hypotension. As for vaginal burning - wet prep negative, UA concerning for UTI. Urine culture sent. Patient tolerated first dose of keflex well in ED. Sending rest of course to pharmacy. Educated patient that she can use OTC vagisil for external vaginal burning. Patient declining further STD testing today as she denies vaginal discharge or bleeding. As for headache - seems to be tension headache. No neuro deficits or other alarming symptoms. Provided patient with tylenol. Educated patient on alternating Advil and Tylenol for symptom management. As for short episode of blurry vision and dizziness - is suspect this is d/t her hypotension vs UTI. No neuro deficits. Symptoms currently not present. Staffed with Dr. Rubin Payor who agrees with plan. I have reviewed the patients home medicines and have  made adjustments as needed Patient was given return precautions. Patient stable for discharge at this time.  Patient verbalized understanding of plan.  Ddx: these are considered less likely due to history of present illness and physical exam -subarachnoid hemorrhage: no neurodeficits, vomiting -meningitis/encephalitis: stable vital signs,  lack of meningismus symptoms -giant cell arteritis: no temporal/jaw pain; fever, vision loss -ischemic stroke/ICH/cervical artery dissection: no neurodeficits    Social Determinants of Health:  geriatric          Final Clinical Impression(s) / ED Diagnoses Final diagnoses:  Acute cystitis without hematuria  Hypotension due to drugs    Rx / DC Orders ED Discharge Orders          Ordered    cephALEXin (KEFLEX) 500 MG capsule  2 times daily        03/29/24 1417              Dorthy Cooler, [place] 03/29/24 1418    Benjiman Core, MD 03/29/24 1510

## 2024-03-29 NOTE — ED Triage Notes (Signed)
 Pt here for HA, low BP, dizziness, and red/burning vagina. Pt states BP was 86/60. Denies urinary issues. C/O bilateral blurred vision.  No hx of migraines.

## 2024-03-31 LAB — URINE CULTURE: Culture: 20000 — AB

## 2024-04-01 ENCOUNTER — Telehealth (HOSPITAL_BASED_OUTPATIENT_CLINIC_OR_DEPARTMENT_OTHER): Payer: Self-pay | Admitting: *Deleted

## 2024-04-01 NOTE — Telephone Encounter (Signed)
 Post ED Visit - Positive Culture Follow-up  Culture report reviewed by antimicrobial stewardship pharmacist: [facility] []  Nathan Batchelder, Pharm.D. []  Celedonio Miyamoto, Pharm.D., BCPS AQ-ID []  Garvin Fila, Pharm.D., BCPS []  Georgina Pillion, [address].D., BCPS []  [place], [address].D., BCPS, AAHIVP []  Estella Husk, Pharm.D., BCPS, AAHIVP []  Lysle Pearl, PharmD, BCPS []  Phillips Climes, PharmD, BCPS []  Agapito Games, PharmD, BCPS []  Verlan Friends, PharmD []  Mervyn Gay, PharmD, BCPS [x]  Lora Paula, PharmD  [facility] []  Len Childs, PharmD []  Greer Pickerel, PharmD []  Adalberto Cole, PharmD []  Perlie Gold, Rph []  Lonell Face) Jean Rosenthal, PharmD []  Earl Many, PharmD []  Junita Push, PharmD []  Dorna Leitz, PharmD []  Terrilee Files, PharmD []  Lynann Beaver, PharmD []  Keturah Barre, PharmD []  Loralee Pacas, PharmD []  Bernadene Person, PharmD   Positive urine culture Treated with Cephalexin, organism sensitive to the same and no further patient follow-up is required at this time.  Patsey Berthold 04/01/2024, 9:29 AM

## 2024-04-02 DIAGNOSIS — M25561 Pain in right knee: Secondary | ICD-10-CM | POA: Diagnosis not present

## 2024-04-04 DIAGNOSIS — M8589 Other specified disorders of bone density and structure, multiple sites: Secondary | ICD-10-CM | POA: Diagnosis not present

## 2024-04-04 DIAGNOSIS — Z13820 Encounter for screening for osteoporosis: Secondary | ICD-10-CM | POA: Diagnosis not present

## 2024-04-04 DIAGNOSIS — M8580 Other specified disorders of bone density and structure, unspecified site: Secondary | ICD-10-CM | POA: Diagnosis not present

## 2024-04-04 DIAGNOSIS — N959 Unspecified menopausal and perimenopausal disorder: Secondary | ICD-10-CM | POA: Diagnosis not present

## 2024-04-05 DIAGNOSIS — M25561 Pain in right knee: Secondary | ICD-10-CM | POA: Diagnosis not present

## 2024-04-08 DIAGNOSIS — M792 Neuralgia and neuritis, unspecified: Secondary | ICD-10-CM | POA: Diagnosis not present

## 2024-04-08 DIAGNOSIS — M1990 Unspecified osteoarthritis, unspecified site: Secondary | ICD-10-CM | POA: Diagnosis not present

## 2024-04-08 DIAGNOSIS — R52 Pain, unspecified: Secondary | ICD-10-CM | POA: Diagnosis not present

## 2024-04-08 DIAGNOSIS — M79671 Pain in right foot: Secondary | ICD-10-CM | POA: Diagnosis not present

## 2024-04-08 DIAGNOSIS — Z96651 Presence of right artificial knee joint: Secondary | ICD-10-CM | POA: Diagnosis not present

## 2024-04-09 DIAGNOSIS — M25561 Pain in right knee: Secondary | ICD-10-CM | POA: Diagnosis not present

## 2024-04-10 DIAGNOSIS — Z6833 Body mass index (BMI) 33.0-33.9, adult: Secondary | ICD-10-CM | POA: Diagnosis not present

## 2024-04-10 DIAGNOSIS — M8580 Other specified disorders of bone density and structure, unspecified site: Secondary | ICD-10-CM | POA: Diagnosis not present

## 2024-04-10 DIAGNOSIS — I10 Essential (primary) hypertension: Secondary | ICD-10-CM | POA: Diagnosis not present

## 2024-04-12 DIAGNOSIS — M25561 Pain in right knee: Secondary | ICD-10-CM | POA: Diagnosis not present

## 2024-04-16 DIAGNOSIS — M25561 Pain in right knee: Secondary | ICD-10-CM | POA: Diagnosis not present

## 2024-04-18 DIAGNOSIS — M25561 Pain in right knee: Secondary | ICD-10-CM | POA: Diagnosis not present

## 2024-04-25 DIAGNOSIS — E539 Vitamin B deficiency, unspecified: Secondary | ICD-10-CM | POA: Diagnosis not present

## 2024-04-25 DIAGNOSIS — M25561 Pain in right knee: Secondary | ICD-10-CM | POA: Diagnosis not present

## 2024-04-25 DIAGNOSIS — Z96651 Presence of right artificial knee joint: Secondary | ICD-10-CM | POA: Diagnosis not present

## 2024-04-25 DIAGNOSIS — M1712 Unilateral primary osteoarthritis, left knee: Secondary | ICD-10-CM | POA: Diagnosis not present

## 2024-05-15 ENCOUNTER — Other Ambulatory Visit: Payer: Self-pay | Admitting: Orthopedic Surgery

## 2024-05-15 DIAGNOSIS — Z96651 Presence of right artificial knee joint: Secondary | ICD-10-CM

## 2024-05-28 ENCOUNTER — Encounter (HOSPITAL_COMMUNITY)

## 2024-05-28 ENCOUNTER — Encounter (HOSPITAL_COMMUNITY): Payer: Self-pay

## 2024-05-28 ENCOUNTER — Ambulatory Visit (HOSPITAL_COMMUNITY)

## 2024-05-31 DIAGNOSIS — Z96651 Presence of right artificial knee joint: Secondary | ICD-10-CM | POA: Diagnosis not present

## 2024-05-31 DIAGNOSIS — Z471 Aftercare following joint replacement surgery: Secondary | ICD-10-CM | POA: Diagnosis not present

## 2024-05-31 DIAGNOSIS — M1712 Unilateral primary osteoarthritis, left knee: Secondary | ICD-10-CM | POA: Diagnosis not present

## 2024-06-05 ENCOUNTER — Encounter (HOSPITAL_COMMUNITY)
Admission: RE | Admit: 2024-06-05 | Discharge: 2024-06-05 | Disposition: A | Discharge status: Home / Self Care | Source: Ambulatory Visit | Attending: Orthopedic Surgery | Admitting: Orthopedic Surgery

## 2024-06-05 ENCOUNTER — Ambulatory Visit (HOSPITAL_COMMUNITY)
Admission: RE | Admit: 2024-06-05 | Discharge: 2024-06-05 | Disposition: A | Discharge status: Home / Self Care | Source: Ambulatory Visit | Attending: Orthopedic Surgery | Admitting: Orthopedic Surgery

## 2024-06-05 DIAGNOSIS — M1712 Unilateral primary osteoarthritis, left knee: Secondary | ICD-10-CM | POA: Diagnosis not present

## 2024-06-05 DIAGNOSIS — G8929 Other chronic pain: Secondary | ICD-10-CM | POA: Diagnosis not present

## 2024-06-05 DIAGNOSIS — Z96651 Presence of right artificial knee joint: Secondary | ICD-10-CM

## 2024-06-05 DIAGNOSIS — M25561 Pain in right knee: Secondary | ICD-10-CM | POA: Diagnosis not present

## 2024-06-05 MED ORDER — TECHNETIUM TC 99M MEDRONATE IV KIT
22.0000 | PACK | Freq: Once | INTRAVENOUS | Status: AC | PRN
  Administered 2024-06-05: 22 via INTRAVENOUS

## 2024-06-06 DIAGNOSIS — Z471 Aftercare following joint replacement surgery: Secondary | ICD-10-CM | POA: Diagnosis not present

## 2024-06-06 DIAGNOSIS — Z96651 Presence of right artificial knee joint: Secondary | ICD-10-CM | POA: Diagnosis not present

## 2024-06-07 ENCOUNTER — Telehealth: Payer: Self-pay | Admitting: Diagnostic Neuroimaging

## 2024-06-07 NOTE — Telephone Encounter (Signed)
 Pt called wanting to know if a surgical clearance was received from [facility] for her knee replacement. Please advise.

## 2024-06-10 DIAGNOSIS — F418 Other specified anxiety disorders: Secondary | ICD-10-CM | POA: Diagnosis not present

## 2024-06-10 DIAGNOSIS — E538 Deficiency of other specified B group vitamins: Secondary | ICD-10-CM | POA: Diagnosis not present

## 2024-06-10 DIAGNOSIS — R7303 Prediabetes: Secondary | ICD-10-CM | POA: Diagnosis not present

## 2024-06-10 DIAGNOSIS — I10 Essential (primary) hypertension: Secondary | ICD-10-CM | POA: Diagnosis not present

## 2024-06-10 DIAGNOSIS — E559 Vitamin D deficiency, unspecified: Secondary | ICD-10-CM | POA: Diagnosis not present

## 2024-06-10 DIAGNOSIS — Z6833 Body mass index (BMI) 33.0-33.9, adult: Secondary | ICD-10-CM | POA: Diagnosis not present

## 2024-06-10 DIAGNOSIS — Z01818 Encounter for other preprocedural examination: Secondary | ICD-10-CM | POA: Diagnosis not present

## 2024-06-10 DIAGNOSIS — G20C Parkinsonism, unspecified: Secondary | ICD-10-CM | POA: Diagnosis not present

## 2024-06-10 NOTE — Telephone Encounter (Signed)
 Surgical clearance was received and signed for the pt. Sent to surgical facility on 6/12 and received confirmation

## 2024-06-12 DIAGNOSIS — Z96651 Presence of right artificial knee joint: Secondary | ICD-10-CM | POA: Diagnosis not present

## 2024-06-24 ENCOUNTER — Ambulatory Visit: Payer: Medicare Other | Admitting: Diagnostic Neuroimaging

## 2024-07-03 DIAGNOSIS — M1711 Unilateral primary osteoarthritis, right knee: Secondary | ICD-10-CM | POA: Diagnosis not present

## 2024-07-09 DIAGNOSIS — M1712 Unilateral primary osteoarthritis, left knee: Secondary | ICD-10-CM | POA: Diagnosis not present

## 2024-07-09 DIAGNOSIS — Z01818 Encounter for other preprocedural examination: Secondary | ICD-10-CM | POA: Diagnosis not present

## 2024-07-09 DIAGNOSIS — Z01812 Encounter for preprocedural laboratory examination: Secondary | ICD-10-CM | POA: Diagnosis not present

## 2024-07-15 DIAGNOSIS — G8918 Other acute postprocedural pain: Secondary | ICD-10-CM | POA: Diagnosis not present

## 2024-07-15 DIAGNOSIS — M1712 Unilateral primary osteoarthritis, left knee: Secondary | ICD-10-CM | POA: Diagnosis not present

## 2024-07-15 DIAGNOSIS — Z79899 Other long term (current) drug therapy: Secondary | ICD-10-CM | POA: Diagnosis not present

## 2024-07-15 DIAGNOSIS — I10 Essential (primary) hypertension: Secondary | ICD-10-CM | POA: Diagnosis not present

## 2024-07-15 DIAGNOSIS — Z7982 Long term (current) use of aspirin: Secondary | ICD-10-CM | POA: Diagnosis not present

## 2024-07-15 DIAGNOSIS — E785 Hyperlipidemia, unspecified: Secondary | ICD-10-CM | POA: Diagnosis not present

## 2024-07-15 DIAGNOSIS — Z853 Personal history of malignant neoplasm of breast: Secondary | ICD-10-CM | POA: Diagnosis not present

## 2024-07-18 DIAGNOSIS — M25562 Pain in left knee: Secondary | ICD-10-CM | POA: Diagnosis not present

## 2024-07-18 DIAGNOSIS — M25662 Stiffness of left knee, not elsewhere classified: Secondary | ICD-10-CM | POA: Diagnosis not present

## 2024-07-18 DIAGNOSIS — Z96652 Presence of left artificial knee joint: Secondary | ICD-10-CM | POA: Diagnosis not present

## 2024-07-18 DIAGNOSIS — R262 Difficulty in walking, not elsewhere classified: Secondary | ICD-10-CM | POA: Diagnosis not present

## 2024-07-20 DIAGNOSIS — M25662 Stiffness of left knee, not elsewhere classified: Secondary | ICD-10-CM | POA: Diagnosis not present

## 2024-07-20 DIAGNOSIS — M25562 Pain in left knee: Secondary | ICD-10-CM | POA: Diagnosis not present

## 2024-07-20 DIAGNOSIS — R262 Difficulty in walking, not elsewhere classified: Secondary | ICD-10-CM | POA: Diagnosis not present

## 2024-07-20 DIAGNOSIS — Z96652 Presence of left artificial knee joint: Secondary | ICD-10-CM | POA: Diagnosis not present

## 2024-07-22 DIAGNOSIS — Z96652 Presence of left artificial knee joint: Secondary | ICD-10-CM | POA: Diagnosis not present

## 2024-07-22 DIAGNOSIS — M25562 Pain in left knee: Secondary | ICD-10-CM | POA: Diagnosis not present

## 2024-07-22 DIAGNOSIS — M25662 Stiffness of left knee, not elsewhere classified: Secondary | ICD-10-CM | POA: Diagnosis not present

## 2024-07-22 DIAGNOSIS — R262 Difficulty in walking, not elsewhere classified: Secondary | ICD-10-CM | POA: Diagnosis not present

## 2024-07-22 DIAGNOSIS — E538 Deficiency of other specified B group vitamins: Secondary | ICD-10-CM | POA: Diagnosis not present

## 2024-07-24 DIAGNOSIS — R262 Difficulty in walking, not elsewhere classified: Secondary | ICD-10-CM | POA: Diagnosis not present

## 2024-07-24 DIAGNOSIS — Z96652 Presence of left artificial knee joint: Secondary | ICD-10-CM | POA: Diagnosis not present

## 2024-07-24 DIAGNOSIS — M25662 Stiffness of left knee, not elsewhere classified: Secondary | ICD-10-CM | POA: Diagnosis not present

## 2024-07-24 DIAGNOSIS — M25562 Pain in left knee: Secondary | ICD-10-CM | POA: Diagnosis not present

## 2024-07-25 DIAGNOSIS — M25562 Pain in left knee: Secondary | ICD-10-CM | POA: Diagnosis not present

## 2024-07-25 DIAGNOSIS — Z96652 Presence of left artificial knee joint: Secondary | ICD-10-CM | POA: Diagnosis not present

## 2024-07-25 DIAGNOSIS — M25662 Stiffness of left knee, not elsewhere classified: Secondary | ICD-10-CM | POA: Diagnosis not present

## 2024-07-25 DIAGNOSIS — R262 Difficulty in walking, not elsewhere classified: Secondary | ICD-10-CM | POA: Diagnosis not present

## 2024-07-26 DIAGNOSIS — Z96652 Presence of left artificial knee joint: Secondary | ICD-10-CM | POA: Diagnosis not present

## 2024-07-26 DIAGNOSIS — Z471 Aftercare following joint replacement surgery: Secondary | ICD-10-CM | POA: Diagnosis not present

## 2024-07-29 DIAGNOSIS — Z96652 Presence of left artificial knee joint: Secondary | ICD-10-CM | POA: Diagnosis not present

## 2024-07-29 DIAGNOSIS — M25662 Stiffness of left knee, not elsewhere classified: Secondary | ICD-10-CM | POA: Diagnosis not present

## 2024-07-29 DIAGNOSIS — R262 Difficulty in walking, not elsewhere classified: Secondary | ICD-10-CM | POA: Diagnosis not present

## 2024-07-29 DIAGNOSIS — M25562 Pain in left knee: Secondary | ICD-10-CM | POA: Diagnosis not present

## 2024-07-31 DIAGNOSIS — M25562 Pain in left knee: Secondary | ICD-10-CM | POA: Diagnosis not present

## 2024-07-31 DIAGNOSIS — Z96652 Presence of left artificial knee joint: Secondary | ICD-10-CM | POA: Diagnosis not present

## 2024-07-31 DIAGNOSIS — M25662 Stiffness of left knee, not elsewhere classified: Secondary | ICD-10-CM | POA: Diagnosis not present

## 2024-07-31 DIAGNOSIS — R262 Difficulty in walking, not elsewhere classified: Secondary | ICD-10-CM | POA: Diagnosis not present

## 2024-08-05 DIAGNOSIS — M25562 Pain in left knee: Secondary | ICD-10-CM | POA: Diagnosis not present

## 2024-08-05 DIAGNOSIS — M25662 Stiffness of left knee, not elsewhere classified: Secondary | ICD-10-CM | POA: Diagnosis not present

## 2024-08-05 DIAGNOSIS — Z96652 Presence of left artificial knee joint: Secondary | ICD-10-CM | POA: Diagnosis not present

## 2024-08-05 DIAGNOSIS — R262 Difficulty in walking, not elsewhere classified: Secondary | ICD-10-CM | POA: Diagnosis not present

## 2024-08-07 DIAGNOSIS — M25562 Pain in left knee: Secondary | ICD-10-CM | POA: Diagnosis not present

## 2024-08-07 DIAGNOSIS — M25662 Stiffness of left knee, not elsewhere classified: Secondary | ICD-10-CM | POA: Diagnosis not present

## 2024-08-07 DIAGNOSIS — Z96652 Presence of left artificial knee joint: Secondary | ICD-10-CM | POA: Diagnosis not present

## 2024-08-07 DIAGNOSIS — R262 Difficulty in walking, not elsewhere classified: Secondary | ICD-10-CM | POA: Diagnosis not present

## 2024-08-09 DIAGNOSIS — R262 Difficulty in walking, not elsewhere classified: Secondary | ICD-10-CM | POA: Diagnosis not present

## 2024-08-09 DIAGNOSIS — Z96652 Presence of left artificial knee joint: Secondary | ICD-10-CM | POA: Diagnosis not present

## 2024-08-09 DIAGNOSIS — M25562 Pain in left knee: Secondary | ICD-10-CM | POA: Diagnosis not present

## 2024-08-09 DIAGNOSIS — M25662 Stiffness of left knee, not elsewhere classified: Secondary | ICD-10-CM | POA: Diagnosis not present

## 2024-08-12 DIAGNOSIS — M25662 Stiffness of left knee, not elsewhere classified: Secondary | ICD-10-CM | POA: Diagnosis not present

## 2024-08-12 DIAGNOSIS — Z96652 Presence of left artificial knee joint: Secondary | ICD-10-CM | POA: Diagnosis not present

## 2024-08-12 DIAGNOSIS — M25562 Pain in left knee: Secondary | ICD-10-CM | POA: Diagnosis not present

## 2024-08-12 DIAGNOSIS — R262 Difficulty in walking, not elsewhere classified: Secondary | ICD-10-CM | POA: Diagnosis not present

## 2024-08-13 DIAGNOSIS — M25662 Stiffness of left knee, not elsewhere classified: Secondary | ICD-10-CM | POA: Diagnosis not present

## 2024-08-13 DIAGNOSIS — M25562 Pain in left knee: Secondary | ICD-10-CM | POA: Diagnosis not present

## 2024-08-13 DIAGNOSIS — Z96652 Presence of left artificial knee joint: Secondary | ICD-10-CM | POA: Diagnosis not present

## 2024-08-13 DIAGNOSIS — R262 Difficulty in walking, not elsewhere classified: Secondary | ICD-10-CM | POA: Diagnosis not present

## 2024-08-14 DIAGNOSIS — M1711 Unilateral primary osteoarthritis, right knee: Secondary | ICD-10-CM | POA: Diagnosis not present

## 2024-08-17 DIAGNOSIS — R262 Difficulty in walking, not elsewhere classified: Secondary | ICD-10-CM | POA: Diagnosis not present

## 2024-08-17 DIAGNOSIS — Z96652 Presence of left artificial knee joint: Secondary | ICD-10-CM | POA: Diagnosis not present

## 2024-08-17 DIAGNOSIS — M25562 Pain in left knee: Secondary | ICD-10-CM | POA: Diagnosis not present

## 2024-08-17 DIAGNOSIS — M25662 Stiffness of left knee, not elsewhere classified: Secondary | ICD-10-CM | POA: Diagnosis not present

## 2024-08-21 DIAGNOSIS — M25562 Pain in left knee: Secondary | ICD-10-CM | POA: Diagnosis not present

## 2024-08-21 DIAGNOSIS — R262 Difficulty in walking, not elsewhere classified: Secondary | ICD-10-CM | POA: Diagnosis not present

## 2024-08-21 DIAGNOSIS — Z96652 Presence of left artificial knee joint: Secondary | ICD-10-CM | POA: Diagnosis not present

## 2024-08-21 DIAGNOSIS — M25662 Stiffness of left knee, not elsewhere classified: Secondary | ICD-10-CM | POA: Diagnosis not present

## 2024-08-21 DIAGNOSIS — M722 Plantar fascial fibromatosis: Secondary | ICD-10-CM | POA: Diagnosis not present

## 2024-08-22 ENCOUNTER — Telehealth: Payer: Self-pay | Admitting: Diagnostic Neuroimaging

## 2024-08-22 ENCOUNTER — Ambulatory Visit: Admitting: Diagnostic Neuroimaging

## 2024-08-22 NOTE — Telephone Encounter (Signed)
 Request to cx, will call back to r/s

## 2024-08-23 ENCOUNTER — Encounter (HOSPITAL_COMMUNITY): Payer: Self-pay

## 2024-08-23 ENCOUNTER — Emergency Department (HOSPITAL_COMMUNITY)

## 2024-08-23 ENCOUNTER — Other Ambulatory Visit: Payer: Self-pay

## 2024-08-23 ENCOUNTER — Emergency Department (HOSPITAL_COMMUNITY)
Admission: EM | Admit: 2024-08-23 | Discharge: 2024-08-26 | Disposition: A | Discharge status: Skilled Nursing Facility | Attending: Emergency Medicine | Admitting: Emergency Medicine

## 2024-08-23 DIAGNOSIS — G20C Parkinsonism, unspecified: Secondary | ICD-10-CM | POA: Diagnosis not present

## 2024-08-23 DIAGNOSIS — Z7982 Long term (current) use of aspirin: Secondary | ICD-10-CM | POA: Diagnosis not present

## 2024-08-23 DIAGNOSIS — M7632 Iliotibial band syndrome, left leg: Secondary | ICD-10-CM | POA: Diagnosis not present

## 2024-08-23 DIAGNOSIS — Z79899 Other long term (current) drug therapy: Secondary | ICD-10-CM | POA: Diagnosis not present

## 2024-08-23 DIAGNOSIS — I10 Essential (primary) hypertension: Secondary | ICD-10-CM | POA: Insufficient documentation

## 2024-08-23 DIAGNOSIS — R531 Weakness: Secondary | ICD-10-CM | POA: Insufficient documentation

## 2024-08-23 DIAGNOSIS — Z96653 Presence of artificial knee joint, bilateral: Secondary | ICD-10-CM | POA: Insufficient documentation

## 2024-08-23 DIAGNOSIS — M25562 Pain in left knee: Secondary | ICD-10-CM | POA: Diagnosis not present

## 2024-08-23 DIAGNOSIS — M25462 Effusion, left knee: Secondary | ICD-10-CM | POA: Diagnosis not present

## 2024-08-23 DIAGNOSIS — M79605 Pain in left leg: Secondary | ICD-10-CM | POA: Diagnosis not present

## 2024-08-23 DIAGNOSIS — M4807 Spinal stenosis, lumbosacral region: Secondary | ICD-10-CM | POA: Diagnosis not present

## 2024-08-23 DIAGNOSIS — Z96652 Presence of left artificial knee joint: Secondary | ICD-10-CM | POA: Diagnosis not present

## 2024-08-23 DIAGNOSIS — Z471 Aftercare following joint replacement surgery: Secondary | ICD-10-CM | POA: Diagnosis not present

## 2024-08-23 LAB — CBC WITH DIFFERENTIAL/PLATELET
Abs Immature Granulocytes: 0.04 K/uL (ref 0.00–0.07)
Basophils Absolute: 0 K/uL (ref 0.0–0.1)
Basophils Relative: 0 %
Eosinophils Absolute: 0 K/uL (ref 0.0–0.5)
Eosinophils Relative: 0 %
HCT: 41.2 % (ref 36.0–46.0)
Hemoglobin: 13.5 g/dL (ref 12.0–15.0)
Immature Granulocytes: 0 %
Lymphocytes Relative: 13 %
Lymphs Abs: 1.4 K/uL (ref 0.7–4.0)
MCH: 30.4 pg (ref 26.0–34.0)
MCHC: 32.8 g/dL (ref 30.0–36.0)
MCV: 92.8 fL (ref 80.0–100.0)
Monocytes Absolute: 0.9 K/uL (ref 0.1–1.0)
Monocytes Relative: 9 %
Neutro Abs: 8 K/uL — ABNORMAL HIGH (ref 1.7–7.7)
Neutrophils Relative %: 78 %
Platelets: 390 K/uL (ref 150–400)
RBC: 4.44 MIL/uL (ref 3.87–5.11)
RDW: 13.7 % (ref 11.5–15.5)
WBC: 10.3 K/uL (ref 4.0–10.5)
nRBC: 0 % (ref 0.0–0.2)

## 2024-08-23 LAB — COMPREHENSIVE METABOLIC PANEL WITH GFR
ALT: <5 U/L (ref 0–44)
AST: 18 U/L (ref 15–41)
Albumin: 4 g/dL (ref 3.5–5.0)
Alkaline Phosphatase: 68 U/L (ref 38–126)
Anion gap: 12 (ref 5–15)
BUN: 19 mg/dL (ref 8–23)
CO2: 22 mmol/L (ref 22–32)
Calcium: 9.4 mg/dL (ref 8.9–10.3)
Chloride: 101 mmol/L (ref 98–111)
Creatinine, Ser: 0.58 mg/dL (ref 0.44–1.00)
GFR, Estimated: >60 mL/min (ref >60)
Glucose, Bld: 129 mg/dL — ABNORMAL HIGH (ref 70–99)
Potassium: 3.6 mmol/L (ref 3.5–5.1)
Sodium: 135 mmol/L (ref 135–145)
Total Bilirubin: 0.7 mg/dL (ref 0.0–1.2)
Total Protein: 7.1 g/dL (ref 6.5–8.1)

## 2024-08-23 NOTE — ED Provider Notes (Signed)
 [place] EMERGENCY DEPARTMENT AT [facility] Provider Note   CSN: [id number] Arrival date & time: 08/23/24  1805     Patient presents with: Bil Leg decreased Movement   Christine Hayes is a 73 y.o. female with history of Parkinson disease, insomnia, hypertension, right-sided total knee replacement, left side total knee replacement.  Patient presents to ED for complaints of generalized weakness of bilateral lower extremities.  Reports that for the last 4 months she has had progressively worsening weakness to legs causing her to use a walker at home.  Reports that on Wednesday of this past week she stood up from a sitting position and had a pop in her left knee which radiated into her left hip.  She reports that this pain caused her to seek medical attention with her orthopedic doctor in [place] to provide her with a steroid taper.  States that while walking to the car today, she became weak in the legs causing her to fall down onto her left knee.  She denies striking her head, chest pain, shortness of breath preceding this.  Denies loss of consciousness.  Reports that they called orthopedic doctor who advised him to come to the office and she received a shot in my blood.  She is unsure what the shot was.  Reports that they went home and her daughter had a hard time getting her out of the car, she fell again because of weakness in her legs generally.  She states that this time she is in no pain.  She states that she just has weakness in her legs bilaterally.  She reports that she has no low back pain.  No groin numbness, bowel or bladder incontinence, fevers, nausea or vomiting.  Denies any significant pain to her left knee or right knee.  She has full ROM of bilateral knees.  HPI     Prior to Admission medications   Medication Sig Start Date End Date Taking? Authorizing Provider  ALPRAZolam  (XANAX ) 0.5 MG tablet for sedation before MRI scan; take 1 tab 1 hour before scan; may  repeat 1 tab 15 min before scan 07/12/21  Yes Penumalli, Vikram R, MD  aspirin  EC 81 MG tablet Take 81 mg by mouth in the morning and at bedtime. For 6 weeks after surgery   Yes [provider]  carbidopa -levodopa  (SINEMET  IR) 25-100 MG tablet Take 2 tablets by mouth 3 (three) times daily. 06/19/23 09/11/24 Yes Penumalli, Vikram R, MD  celecoxib  (CELEBREX ) 200 MG capsule Take 200 mg by mouth daily as needed for moderate pain (pain score 4-6).   Yes [provider]  gabapentin  (NEURONTIN ) 300 MG capsule Take 300 mg by mouth 2 (two) times daily.   Yes [provider]  LORazepam  (ATIVAN ) 0.5 MG tablet Take 0.5 mg by mouth 2 (two) times daily as needed for sleep or anxiety.   Yes [provider]  methylPREDNISolone  (MEDROL  DOSEPAK) 4 MG TBPK tablet Take by mouth as directed. 08/22/24  Yes [provider]  olmesartan (BENICAR) 5 MG tablet Take 5 mg by mouth daily.   Yes [provider]  PSYLLIUM PO Take 2 tablets by mouth daily.   Yes [provider]  rosuvastatin  (CRESTOR ) 40 MG tablet Take 40 mg by mouth daily. 03/03/20  Yes [provider]  traMADol  (ULTRAM ) 50 MG tablet Take 50 mg by mouth every 6 (six) hours as needed for moderate pain (pain score 4-6). 07/26/24  Yes [provider]  Allergies: Patient has no known allergies.    Review of Systems  Musculoskeletal:  Positive for arthralgias.  Neurological:  Positive for weakness.  All other systems reviewed and are negative.   Updated Vital Signs BP (!) 145/82   Pulse 75   Temp 97.6 F (36.4 C) (Oral)   Resp 16   SpO2 99%   Physical Exam Vitals and nursing note reviewed.  Constitutional:      General: She is not in acute distress.    Appearance: She is well-developed.  HENT:     Head: Normocephalic and atraumatic.  Eyes:     Conjunctiva/sclera: Conjunctivae normal.  Cardiovascular:     Rate and Rhythm: Normal rate and regular rhythm.     Heart sounds: No  murmur heard. Pulmonary:     Effort: Pulmonary effort is normal. No respiratory distress.     Breath sounds: Normal breath sounds.  Abdominal:     Palpations: Abdomen is soft.     Tenderness: There is no abdominal tenderness.  Musculoskeletal:        General: No swelling.     Cervical back: Neck supple.     Comments: Surgical scars to bilateral anterior knee.  Bilateral knees with full ROM, nonpainful.  Erythema to anterior left knee.  Skin:    General: Skin is warm and dry.     Capillary Refill: Capillary refill takes less than 2 seconds.  Neurological:     Mental Status: She is alert and oriented to person, place, and time. Mental status is at baseline.     Comments: 5 out of 5 strength bilateral lower extremities.  Intact DTRs.  Psychiatric:        Mood and Affect: Mood normal.     (all labs ordered are listed, but only abnormal results are displayed) Labs Reviewed  CBC WITH DIFFERENTIAL/PLATELET - Abnormal; Notable for the following components:      Result Value   Neutro Abs 8.0 (*)    All other components within normal limits  COMPREHENSIVE METABOLIC PANEL WITH GFR - Abnormal; Notable for the following components:   Glucose, Bld 129 (*)    All other components within normal limits  URINALYSIS, ROUTINE W REFLEX MICROSCOPIC - Abnormal; Notable for the following components:   APPearance HAZY (*)    Hgb urine dipstick SMALL (*)    Ketones, ur 5 (*)    Leukocytes,Ua LARGE (*)    Bacteria, UA RARE (*)    Non Squamous Epithelial 0-5 (*)    All other components within normal limits    EKG: None  Radiology: DG Knee Complete 4 Views Left Result Date: 08/23/2024 CLINICAL DATA:  Left lower extremity pain. EXAM: LEFT KNEE - COMPLETE 4+ VIEW COMPARISON:  None Available. FINDINGS: Left knee arthroplasty in expected alignment. No periprosthetic lucency. No acute or periprosthetic fracture. Prior patellar resurfacing. Moderate joint effusion. Suggestion of diffuse soft tissue edema.  IMPRESSION: 1. Left knee arthroplasty without complication. 2. Moderate joint effusion. 3. Suggestion of diffuse soft tissue edema. Electronically Signed   By: Andrea Gasman M.D.   On: 08/23/2024 20:40    Procedures   Medications Ordered in the ED  aspirin  EC tablet 81 mg (has no administration in time range)  carbidopa -levodopa  (SINEMET  IR) 25-100 MG per tablet immediate release 2 tablet (has no administration in time range)  celecoxib  (CELEBREX ) capsule 200 mg (has no administration in time range)     Medical Decision Making Amount and/or Complexity of Data Reviewed Labs: ordered.  Risk OTC drugs. Prescription drug management.   This is a 73 year old female presenting to the ED out of concern of weakness in bilateral legs.  On exam she is hemodynamically stable.  She is afebrile and nontachycardic.  Her lung sounds are clear bilaterally, she is not hypoxic.  Her abdomen is soft and compressible.  Neurological examinations at baseline with 5 out of 5 strength bilateral lower extremities.  She denies any lower back pain.  She has surgical scars to bilateral anterior knees from previous knee arthroplasty.  She has slight erythema to her left anterior knee but she has full ROM of her left knee without pain.  Patient labs initiated in triage include CBC, CMP, urinalysis, x-ray imaging of left knee.  CBC without leukocytosis or anemia.  Metabolic panel gross unremarkable, anion gap 12, no elevated LFTs.  Urinalysis shows small hemoglobin, leukocytes, bacteria.  She denies dysuria but states that ever since being in the hospital she feels like she needs to pee all the time but cannot.  Urine cultured, started on Keflex .  X-ray of left knee shows small joint effusion but no complications regarding her left knee arthroplasty.  At this time, suspect patient is in need of PT/OT evaluation.  See no indications for admission to hospital tonight.  She reports progressively worsening weakness of her  bilateral lower extremities over the course of the last 4 months prompting her to use a walker.  At this time, pharmacy tech medical reconciliation has been ordered.  Patient has a diet ordered.  Home medications have been ordered.  Placed in boarder status.  PT/OT evaluation ordered.    Final diagnoses:  Generalized weakness    ED Discharge Orders     None          Christine Hayes 08/24/24 ARTEMUS Raford Lenis, MD 08/24/24 0600

## 2024-08-23 NOTE — ED Provider Triage Note (Signed)
 Emergency Medicine Provider Triage Evaluation Note  Christine Hayes , a 73 y.o. female  was evaluated in triage.  Pt complains of gait problems. Patient stood up and felt a pop 2 days ago and has not been able to put weight on knee since. Patient denies pain or weakness in leg - but states that this leg will just buckle and she is not able to stand or do any ADLS. Denies swelling. Denies fever, nausea, vomiting, diarrhea. Denies numbness. Patient went to provider earlier today and had reassuring xrays.   Review of Systems  Positive:  Negative:   Physical Exam  BP 135/85 (BP Location: Right Arm)   Pulse 99   Temp 97.7 F (36.5 C)   Resp 17   SpO2 97%  Gen:   Awake, no distress   Resp:  Normal effort  MSK:   Moves extremities without difficulty  Other:    Medical Decision Making  Medically screening exam initiated at 7:18 PM.  Appropriate orders placed.  TIMARIE LABELL was informed that the remainder of the evaluation will be completed by another provider, this initial triage assessment does not replace that evaluation, and the importance of remaining in the ED until their evaluation is complete.     Hoy Nidia FALCON, [place] 08/23/24 1921

## 2024-08-23 NOTE — ED Notes (Signed)
 PT was able to stand and pivot from wheelchair to commode with x1 assist

## 2024-08-23 NOTE — ED Provider Notes (Incomplete)
 [place] EMERGENCY DEPARTMENT AT [facility] Provider Note   CSN: [id number] Arrival date & time: 08/23/24  1805     Patient presents with: Bil Leg decreased Movement   Christine Hayes is a 73 y.o. female with history of Parkinson disease, insomnia, hypertension, right-sided total knee replacement,  HPI     Prior to Admission medications   Medication Sig Start Date End Date Taking? Authorizing Provider  ALPRAZolam  (XANAX ) 0.5 MG tablet for sedation before MRI scan; take 1 tab 1 hour before scan; may repeat 1 tab 15 min before scan 07/12/21   Penumalli, Vikram R, MD  aspirin EC 81 MG tablet Take 81 mg by mouth daily.    [provider]  carbidopa -levodopa  (SINEMET  IR) 25-100 MG tablet Take 2 tablets by mouth 3 (three) times daily. 06/19/23 09/11/24  Penumalli, Vikram R, MD  Cholecalciferol (VITAMIN D3) 1.25 MG (50000 UT) CAPS Take by mouth 3 (three) times daily.    [provider]  diazepam (VALIUM) 5 MG tablet diazepam 5 mg tablet  TAKE ONE TABLET BY MOUTH 1 HOUR PRIOR TO MRI. REPEAT AS NEEDED. DRIVER NEEDED.    [provider]  olmesartan (BENICAR) 20 MG tablet Take 5 mg by mouth daily. 07/19/21   [provider]  psyllium (METAMUCIL) 58.6 % packet Take 1 packet by mouth as needed.    [provider]  rosuvastatin (CRESTOR) 40 MG tablet Take 40 mg by mouth daily. 03/03/20   [provider]    Allergies: Patient has no known allergies.    Review of Systems  Updated Vital Signs BP (!) 157/75 (BP Location: Right Arm)   Pulse 79   Temp 97.7 F (36.5 C) (Oral)   Resp 18   SpO2 100%   Physical Exam  (all labs ordered are listed, but only abnormal results are displayed) Labs Reviewed  CBC WITH DIFFERENTIAL/PLATELET - Abnormal; Notable for the following components:      Result Value   Neutro Abs 8.0 (*)    All other components within normal limits  COMPREHENSIVE METABOLIC PANEL WITH GFR - Abnormal; Notable for the  following components:   Glucose, Bld 129 (*)    All other components within normal limits    EKG: None  Radiology: DG Knee Complete 4 Views Left Result Date: 08/23/2024 CLINICAL DATA:  Left lower extremity pain. EXAM: LEFT KNEE - COMPLETE 4+ VIEW COMPARISON:  None Available. FINDINGS: Left knee arthroplasty in expected alignment. No periprosthetic lucency. No acute or periprosthetic fracture. Prior patellar resurfacing. Moderate joint effusion. Suggestion of diffuse soft tissue edema. IMPRESSION: 1. Left knee arthroplasty without complication. 2. Moderate joint effusion. 3. Suggestion of diffuse soft tissue edema. Electronically Signed   By: Andrea Gasman M.D.   On: 08/23/2024 20:40    {Document cardiac monitor, telemetry assessment procedure when appropriate:32947} Procedures   Medications Ordered in the ED - No data to display    {Click here for ABCD2, HEART and other calculators REFRESH Note before signing:1}                              Medical Decision Making  ***  {Document critical care time when appropriate  Document review of labs and clinical decision tools ie [id number], etc  Document your independent review of radiology images and any outside records  Document your discussion with family members, caretakers and with consultants  Document social determinants of health affecting pt's care  Document your decision making why or why not admission, treatments were needed:32947:::1}   Final diagnoses:  None    ED Discharge Orders     None

## 2024-08-23 NOTE — ED Triage Notes (Signed)
 Pt came in via POV d/t noticing that her legs couldn't move appropriately. Can lift them a little while sitting. But cannot keep them from buckling while standing. A/Ox4, noticed this happening at approx 1000 today. Sensation intact, can still move her feet, denies pain.

## 2024-08-24 LAB — URINALYSIS, ROUTINE W REFLEX MICROSCOPIC
Bilirubin Urine: NEGATIVE
Glucose, UA: NEGATIVE mg/dL
Ketones, ur: 5 mg/dL — AB
Nitrite: NEGATIVE
Protein, ur: NEGATIVE mg/dL
Specific Gravity, Urine: 1.025 (ref 1.005–1.030)
pH: 5 (ref 5.0–8.0)

## 2024-08-24 MED ORDER — TRAMADOL HCL 50 MG PO TABS: 50.0000 mg | ORAL_TABLET | Freq: Four times a day (QID) | ORAL | Status: DC | PRN

## 2024-08-24 MED ORDER — CARBIDOPA-LEVODOPA 25-100 MG PO TABS
2.0000 | ORAL_TABLET | Freq: Three times a day (TID) | ORAL | Status: DC
  Administered 2024-08-24 – 2024-08-26 (×7): 2 via ORAL
  Filled 2024-08-24 (×7): qty 2

## 2024-08-24 MED ORDER — CEPHALEXIN 250 MG PO CAPS
500.0000 mg | ORAL_CAPSULE | Freq: Four times a day (QID) | ORAL | Status: DC
  Administered 2024-08-24 – 2024-08-26 (×6): 500 mg via ORAL
  Filled 2024-08-24 (×6): qty 2

## 2024-08-24 MED ORDER — GABAPENTIN 300 MG PO CAPS
300.0000 mg | ORAL_CAPSULE | Freq: Two times a day (BID) | ORAL | Status: DC
  Administered 2024-08-24 – 2024-08-26 (×5): 300 mg via ORAL
  Filled 2024-08-24 (×5): qty 1

## 2024-08-24 MED ORDER — CELECOXIB 200 MG PO CAPS
200.0000 mg | ORAL_CAPSULE | Freq: Every day | ORAL | Status: DC | PRN
  Filled 2024-08-24 (×3): qty 1

## 2024-08-24 MED ORDER — IRBESARTAN 75 MG PO TABS
37.5000 mg | ORAL_TABLET | Freq: Every day | ORAL | Status: DC
  Administered 2024-08-24 – 2024-08-26 (×2): 37.5 mg via ORAL
  Filled 2024-08-24 (×4): qty 0.5

## 2024-08-24 MED ORDER — ASPIRIN 81 MG PO TBEC
81.0000 mg | DELAYED_RELEASE_TABLET | Freq: Two times a day (BID) | ORAL | Status: DC
  Administered 2024-08-24 – 2024-08-26 (×5): 81 mg via ORAL
  Filled 2024-08-24 (×5): qty 1

## 2024-08-24 MED ORDER — LORAZEPAM 0.5 MG PO TABS
0.5000 mg | ORAL_TABLET | Freq: Two times a day (BID) | ORAL | Status: DC | PRN
  Administered 2024-08-26: 0.5 mg via ORAL
  Filled 2024-08-24: qty 1

## 2024-08-24 MED ORDER — ROSUVASTATIN CALCIUM 5 MG PO TABS
40.0000 mg | ORAL_TABLET | Freq: Every day | ORAL | Status: DC
  Administered 2024-08-24 – 2024-08-26 (×3): 40 mg via ORAL
  Filled 2024-08-24 (×3): qty 8

## 2024-08-24 MED ORDER — PSYLLIUM 95 % PO PACK
1.0000 | PACK | Freq: Two times a day (BID) | ORAL | Status: DC
  Administered 2024-08-24 – 2024-08-25 (×2): 1 via ORAL
  Filled 2024-08-24 (×5): qty 1

## 2024-08-24 MED ORDER — METHYLPREDNISOLONE 16 MG PO TABS
16.0000 mg | ORAL_TABLET | ORAL | Status: AC
  Administered 2024-08-24: 16 mg via ORAL
  Filled 2024-08-24: qty 1

## 2024-08-24 NOTE — [place] FL2 (Signed)
 [place]  MEDICAID FL2 LEVEL OF CARE FORM     IDENTIFICATION  Patient Name: Christine Hayes Birthdate: [birth year] Sex: female Admission Date (Current Location): 08/23/2024  [facility] and [place] Number:  Producer, television/film/video and Address:  [facility], [address], [place], [place] [address]      Provider Number: [id number]  Attending Physician Name and Address:  Yolande Lamar BROCKS, MD  Relative Name and Phone Number:       Current Level of Care: Hospital Recommended Level of Care: Skilled Nursing Facility Prior Approval Number:    Date Approved/Denied:   PASRR Number: [id number]  Discharge Plan: Home    Current Diagnoses: Patient Active Problem List   Diagnosis Date Noted   Abnormal Pap smear 06/26/2017   Anxiety 06/26/2017   Breast disorder 06/26/2017   Cancer (HCC) 06/26/2017   Lipidosis 06/26/2017    Orientation RESPIRATION BLADDER Height & Weight     Self, Time, Situation, Place  Normal Continent Weight:   Height:     BEHAVIORAL SYMPTOMS/MOOD NEUROLOGICAL BOWEL NUTRITION STATUS      Continent Diet (Regular)  AMBULATORY STATUS COMMUNICATION OF NEEDS Skin   Extensive Assist Verbally Normal                       Personal Care Assistance Level of Assistance  Bathing, Feeding, Dressing Bathing Assistance: Limited assistance Feeding assistance: Limited assistance Dressing Assistance: Limited assistance     Functional Limitations Info  Sight, Speech, Hearing Sight Info: Adequate Hearing Info: Adequate Speech Info: Adequate    SPECIAL CARE FACTORS FREQUENCY  OT (By licensed OT), PT (By licensed PT)     PT Frequency: 5x weekly OT Frequency: 5x weekly            Contractures Contractures Info: Not present    Additional Factors Info  Code Status, Allergies Code Status Info: Full Code Allergies Info: No Known Allergies           Current Medications (08/24/2024):  This is the current hospital active medication  list Current Facility-Administered Medications  Medication Dose Route Frequency Provider Last Rate Last Admin   aspirin  EC tablet 81 mg  81 mg Oral BID Groce, Christopher F, PA-C       carbidopa -levodopa  (SINEMET  IR) 25-100 MG per tablet immediate release 2 tablet  2 tablet Oral TID Groce, Christopher F, PA-C       celecoxib  (CELEBREX ) capsule 200 mg  200 mg Oral Daily PRN Ruthell Lonni FALCON, PA-C       Current Outpatient Medications  Medication Sig Dispense Refill   ALPRAZolam  (XANAX ) 0.5 MG tablet for sedation before MRI scan; take 1 tab 1 hour before scan; may repeat 1 tab 15 min before scan 3 tablet 0   aspirin  EC 81 MG tablet Take 81 mg by mouth in the morning and at bedtime. For 6 weeks after surgery     carbidopa -levodopa  (SINEMET  IR) 25-100 MG tablet Take 2 tablets by mouth 3 (three) times daily. 540 tablet 4   celecoxib  (CELEBREX ) 200 MG capsule Take 200 mg by mouth daily as needed for moderate pain (pain score 4-6).     gabapentin  (NEURONTIN ) 300 MG capsule Take 300 mg by mouth 2 (two) times daily.     LORazepam  (ATIVAN ) 0.5 MG tablet Take 0.5 mg by mouth 2 (two) times daily as needed for sleep or anxiety.     methylPREDNISolone  (MEDROL  DOSEPAK) 4 MG TBPK tablet Take by mouth  as directed.     olmesartan (BENICAR) 5 MG tablet Take 5 mg by mouth daily.     PSYLLIUM PO Take 2 tablets by mouth daily.     rosuvastatin  (CRESTOR ) 40 MG tablet Take 40 mg by mouth daily.     traMADol  (ULTRAM ) 50 MG tablet Take 50 mg by mouth every 6 (six) hours as needed for moderate pain (pain score 4-6).       Discharge Medications: Please see discharge summary for a list of discharge medications.  Relevant Imaging Results:  Relevant Lab Results:   Additional Information SSN: [id number]  Niels LITTIE Portugal, LCSW

## 2024-08-24 NOTE — Evaluation (Signed)
 Physical Therapy Evaluation Patient Details Name: Christine Hayes MRN: [id number] DOB: [birth year] Today's Date: 08/24/2024  History of Present Illness  Pt is a 73 y.o. female presenting 8/29 with BLE weakness. PMH: Parkinson's disease, insomnia, HTN, Bil TKR  Clinical Impression  Pt presents to PT with deficits in functional mobility, gait, balance, strength, power, ROM. Pt currently lacks active knee ROM of L knee and stands with both knees in a flexed position due to generalized LE weakness. Pt remains at a high risk for falls due to this weakness. Of note the pt reports not receiving her carbidopa  since arriving to the ED, which could be contributing to impairments in mobility. PT provides education on HEP to focus on knee extension ROM and strengthening. Patient will benefit from continued inpatient follow up therapy, <3 hours/day.        If plan is discharge home, recommend the following: A lot of help with walking and/or transfers;A lot of help with bathing/dressing/bathroom;Assistance with cooking/housework;Assist for transportation;Help with stairs or ramp for entrance   Can travel by private vehicle   No    Equipment Recommendations Wheelchair (measurements PT);Wheelchair cushion (measurements PT)  Recommendations for Other Services       Functional Status Assessment Patient has had a recent decline in their functional status and demonstrates the ability to make significant improvements in function in a reasonable and predictable amount of time.     Precautions / Restrictions Precautions Precautions: Fall Recall of Precautions/Restrictions: Intact Restrictions Weight Bearing Restrictions Per Provider Order: No      Mobility  Bed Mobility               General bed mobility comments: received sitting at edge of bed    Transfers Overall transfer level: Needs assistance Equipment used: Rolling walker (2 wheels) Transfers: Sit to/from Stand Sit to Stand: Min  assist, From elevated surface           General transfer comment: pt rises to standing but maintains bilateral knee flexion due to weakness    Ambulation/Gait Ambulation/Gait assistance: Min assist Gait Distance (Feet): 2 Feet Assistive device: Rolling walker (2 wheels) Gait Pattern/deviations: Shuffle, Knee flexed in stance - right, Knee flexed in stance - left Gait velocity: reduced Gait velocity interpretation: <1.31 ft/sec, indicative of household ambulator   General Gait Details: pt takes 2 short steps forward and backward, knees remain flexed throughout gait  Stairs            Wheelchair Mobility     Tilt Bed    Modified Rankin (Stroke Patients Only)       Balance Overall balance assessment: Needs assistance Sitting-balance support: No upper extremity supported, Feet supported Sitting balance-Leahy Scale: Good     Standing balance support: Bilateral upper extremity supported, Reliant on assistive device for balance Standing balance-Leahy Scale: Poor                               Pertinent Vitals/Pain Pain Assessment Pain Assessment: 0-10 Pain Score: 4  Pain Location: R knee Pain Descriptors / Indicators: Sore Pain Intervention(s): Monitored during session    Home Living Family/patient expects to be discharged to:: Private residence Living Arrangements: Spouse/significant other Available Help at Discharge: Family;Available 24 hours/day Type of Home: House Home Access: Level entry       Home Layout: One level Home Equipment: Agricultural consultant (2 wheels);Cane - single point;BSC/3in1;Shower seat      Prior Function  Prior Level of Function : Independent/Modified Independent             Mobility Comments: ambulatory with RW, still rehabbing from L TKA which occurred in late July       Extremity/Trunk Assessment   Upper Extremity Assessment Upper Extremity Assessment: Overall WFL for tasks assessed    Lower Extremity  Assessment Lower Extremity Assessment: RLE deficits/detail;LLE deficits/detail RLE Deficits / Details: AROM WFL, grossly 4-/5 LLE Deficits / Details: pt lacking ~10 degrees active knee extension, PROM WFL, grossly 4-/5    Cervical / Trunk Assessment Cervical / Trunk Assessment: Normal  Communication   Communication Communication: No apparent difficulties    Cognition Arousal: Alert Behavior During Therapy: WFL for tasks assessed/performed   PT - Cognitive impairments: No apparent impairments                         Following commands: Intact       Cueing Cueing Techniques: Verbal cues     General Comments General comments (skin integrity, edema, etc.): VSS on RA    Exercises General Exercises - Lower Extremity Ankle Circles/Pumps: AROM, Both, 5 reps Quad Sets: AROM, Both, 5 reps Long Arc Quad: AROM, Both, 5 reps Heel Slides: AROM, Both   Assessment/Plan    PT Assessment Patient needs continued PT services  PT Problem List Decreased strength;Decreased activity tolerance;Decreased range of motion;Decreased balance;Decreased mobility;Pain       PT Treatment Interventions DME instruction;Gait training;Functional mobility training;Therapeutic activities;Therapeutic exercise;Balance training;Neuromuscular re-education;Cognitive remediation;Patient/family education;Wheelchair mobility training    PT Goals (Current goals can be found in the Care Plan section)  Acute Rehab PT Goals Patient Stated Goal: to return to independent ambulation PT Goal Formulation: With patient Time For Goal Achievement: 09/07/24 Potential to Achieve Goals: Fair Additional Goals Additional Goal #1: Pt will mobilize in a manual wheelchair for >100' independently to demonstrate the ability to mobilize within the home    Frequency Min 3X/week     Co-evaluation               AM-PAC PT 6 Clicks Mobility  Outcome Measure Help needed turning from your back to your side while in  a flat bed without using bedrails?: A Little Help needed moving from lying on your back to sitting on the side of a flat bed without using bedrails?: A Little Help needed moving to and from a bed to a chair (including a wheelchair)?: A Little Help needed standing up from a chair using your arms (e.g., wheelchair or bedside chair)?: A Little Help needed to walk in hospital room?: Total Help needed climbing 3-5 steps with a railing? : Total 6 Click Score: 14    End of Session Equipment Utilized During Treatment: Gait belt Activity Tolerance: Patient tolerated treatment well Patient left: in bed;with call bell/phone within reach Nurse Communication: Mobility status PT Visit Diagnosis: Other abnormalities of gait and mobility (R26.89);Muscle weakness (generalized) (M62.81);History of falling (Z91.81)    Time: 9254-9197 PT Time Calculation (min) (ACUTE ONLY): 17 min   Charges:   PT Evaluation $PT Eval Low Complexity: 1 Low   PT General Charges $$ ACUTE PT VISIT: 1 Visit         Bernardino JINNY Ruth, PT, DPT Acute Rehabilitation Office [phone]   Bernardino JINNY Ruth 08/24/2024, 8:17 AM

## 2024-08-24 NOTE — Evaluation (Signed)
 Occupational Therapy Evaluation Patient Details Name: Christine Hayes MRN: [id number] DOB: 18-Jan-1951 Today's Date: 08/24/2024   History of Present Illness   Pt is a 73 y.o. female presenting 8/29 with BLE weakness. PMH: Parkinson's disease, insomnia, HTN, Bil TKR     Clinical Impressions PTA, pt lived with husband and was mod I for BADL, IADL, and driving. Recently using RW since L TKR. Upon eval, pt presents with decreased BLE strength, activity tolerance, balance, and core strength. Pt needing up to set-up for UB ADL and min A for LB ADL. Pt to continue to benefit from acute OT services acutely. Pt motivated to return to PLOF. Patient will benefit from continued inpatient follow up therapy, <3 hours/day.       If plan is discharge home, recommend the following:   A little help with walking and/or transfers;A little help with bathing/dressing/bathroom;Assistance with cooking/housework;Assist for transportation;Help with stairs or ramp for entrance     Functional Status Assessment   Patient has had a recent decline in their functional status and demonstrates the ability to make significant improvements in function in a reasonable and predictable amount of time.     Equipment Recommendations   Other (comment) (defer)     Recommendations for Other Services         Precautions/Restrictions   Precautions Precautions: Fall Recall of Precautions/Restrictions: Intact Restrictions Weight Bearing Restrictions Per Provider Order: No     Mobility Bed Mobility               General bed mobility comments: received sitting at edge of bed    Transfers Overall transfer level: Needs assistance Equipment used: Rolling walker (2 wheels) Transfers: Sit to/from Stand Sit to Stand: Min assist           General transfer comment: pt rises to standing but maintains bilateral knee flexion due to weakness with increased time to rise      Balance Overall balance  assessment: Needs assistance Sitting-balance support: No upper extremity supported, Feet supported Sitting balance-Leahy Scale: Good     Standing balance support: Bilateral upper extremity supported, Reliant on assistive device for balance Standing balance-Leahy Scale: Poor                             ADL either performed or assessed with clinical judgement   ADL Overall ADL's : Needs assistance/impaired Eating/Feeding: Set up;Sitting   Grooming: Set up;Sitting   Upper Body Bathing: Set up;Sitting   Lower Body Bathing: Minimal assistance;Sit to/from stand   Upper Body Dressing : Set up;Sitting   Lower Body Dressing: Minimal assistance;Sit to/from stand   Toilet Transfer: Minimal assistance;Rolling walker (2 wheels) Toilet Transfer Details (indicate cue type and reason): up from [place] of hospital bed         Functional mobility during ADLs: Minimal assistance;Rolling walker (2 wheels)       Vision Patient Visual Report: No change from baseline       Perception Perception: Not tested       Praxis Praxis: Not tested       Pertinent Vitals/Pain Pain Assessment Pain Assessment: Faces Faces Pain Scale: Hurts little more Pain Location: R knee Pain Descriptors / Indicators: Sore Pain Intervention(s): Limited activity within patient's tolerance, Monitored during session     Extremity/Trunk Assessment Upper Extremity Assessment Upper Extremity Assessment: Overall WFL for tasks assessed   Lower Extremity Assessment Lower Extremity Assessment: Defer to PT evaluation  Cervical / Trunk Assessment Cervical / Trunk Assessment: Normal   Communication Communication Communication: No apparent difficulties   Cognition Arousal: Alert Behavior During Therapy: WFL for tasks assessed/performed Cognition: No apparent impairments             OT - Cognition Comments: hx of PD, however, in earlier stages with dx 3 years ago. able to do basic multitasking,  memory is good and pt with minimal masked facial expression                 Following commands: Intact       Cueing  General Comments   Cueing Techniques: Verbal cues      Exercises Exercises: Other exercises Other Exercises Other Exercises: reviewed chair push ups for EOB and glute bridges in bed to facilitate strengthening   Shoulder Instructions      Home Living Family/patient expects to be discharged to:: Private residence Living Arrangements: Spouse/significant other Available Help at Discharge: Family;Available 24 hours/day Type of Home: House Home Access: Level entry     Home Layout: One level     Bathroom Shower/Tub: Tub/shower unit;Walk-in shower         Home Equipment: Agricultural consultant (2 wheels);Cane - single point;BSC/3in1;Shower seat          Prior Functioning/Environment Prior Level of Function : Independent/Modified Independent             Mobility Comments: ambulatory with RW, still rehabbing from L TKA which occurred in late July ADLs Comments: previously independent with ADL and IADL within the home. Prior to recent knee surgery, was also grocery shopping, etc    OT Problem List: Decreased strength;Decreased activity tolerance;Impaired balance (sitting and/or standing);Decreased coordination;Decreased safety awareness;Decreased knowledge of use of DME or AE;Pain   OT Treatment/Interventions: Self-care/ADL training;Therapeutic exercise;DME and/or AE instruction;Therapeutic activities;Patient/family education;Balance training      OT Goals(Current goals can be found in the care plan section)   Acute Rehab OT Goals Patient Stated Goal: get better OT Goal Formulation: With patient Time For Goal Achievement: 09/07/24 Potential to Achieve Goals: Good   OT Frequency:  Min 2X/week    Co-evaluation              AM-PAC OT 6 Clicks Daily Activity     Outcome Measure Help from another person eating meals?: None Help from  another person taking care of personal grooming?: A Little Help from another person toileting, which includes using toliet, bedpan, or urinal?: A Lot Help from another person bathing (including washing, rinsing, drying)?: A Lot Help from another person to put on and taking off regular upper body clothing?: A Little Help from another person to put on and taking off regular lower body clothing?: A Little 6 Click Score: 17   End of Session Equipment Utilized During Treatment: Gait belt;Rolling walker (2 wheels) Nurse Communication: Mobility status;Other (comment) (asking for morning medication for PD and other medication)  Activity Tolerance: Patient tolerated treatment well Patient left: in bed;Other (comment) (EOB with RN entering room)  OT Visit Diagnosis: Unsteadiness on feet (R26.81);Muscle weakness (generalized) (M62.81);Pain;History of falling (Z91.81) Pain - Right/Left: Right Pain - part of body: Knee                Time: 1100-1127 OT Time Calculation (min): 27 min Charges:  OT General Charges $OT Visit: 1 Visit OT Evaluation $OT Eval Low Complexity: 1 Low OT Treatments $Self Care/Home Management : 8-22 mins  Elma JONETTA Penner, OTD, OTR/L [facility] Acute Rehabilitation Office: (  [phone]   Elma JONETTA Penner 08/24/2024, 1:35 PM

## 2024-08-24 NOTE — ED Provider Notes (Signed)
 Emergency Medicine Observation Re-evaluation Note  Christine Hayes is a 73 y.o. female, seen on rounds today.  Pt initially presented to the ED for complaints of Bil Leg decreased Movement Currently, the patient is awaiting nursing home placement.  Physical Exam  BP (!) 145/82   Pulse 75   Temp 97.6 F (36.4 C) (Oral)   Resp 16   SpO2 99%  Physical Exam General: No acute distress Cardiac:  Lungs: No respiratory Psych: Baseline  ED Course / MDM  EKG:   I have reviewed the labs performed to date as well as medications administered while in observation.  Recent changes in the last 24 hours include social worker working on nursing home placement.  No acute events.  Urinalysis done today did have a white blood cell count 21-50 bacteria was rare.  White count was normal.  Will send urine for culture.  And start her on Keflex .  Plan  Current plan is for nursing home placement.    Zykerria Tanton, MD 08/24/24 [phone]

## 2024-08-24 NOTE — Progress Notes (Addendum)
 10:35am: Patient's insurance authorization has been approved, #[id number], start date of 08/26/24.  10:10am: CSW spoke with patient at bedside to present her with bed offers. Patient states she wants to be placed at [facility] in [place].  CSW spoke with Randine at [facility] who states the facility can accept patient but it won't be until Monday. Randine requests CSW initiate insurance authorization for a start date of Monday.  CSW updated patient of information. CSW offered to contact patient's husband, patient declined.   RN aware of information.  9:45am: CSW spoke with Tinnie at [facility] who states she is agreeable to review referral.  CSW spoke with Fairy at [facility] who states the facility does not accept [facility]  9:09am: CSW received consult for possible SNF placement.  CSW spoke with patient to obtain permission to initiate a SNF workup. Patient states she has never been to SNF before but is agreeable for short term rehab.  CSW will complete FL2 and fax patient's clinical information out to obtain bed offers.  Niels Portugal, MSW, LCSW Transitions of Care  Clinical Social Worker II [phone]

## 2024-08-25 NOTE — ED Notes (Signed)
 This RN placed patient on bedpan.

## 2024-08-25 NOTE — ED Provider Notes (Deleted)
 Emergency Medicine Observation Re-evaluation Note  Christine Hayes is a 73 y.o. female, seen on rounds today.  Pt initially presented to the ED for complaints of Bil Leg decreased Movement Currently, the patient is resting.  Physical Exam  BP 131/80 (BP Location: Right Arm)   Pulse 79   Temp 98 F (36.7 C)   Resp 18   SpO2 94%  Physical Exam General: nad   ED Course / MDM  EKG:   I have reviewed the labs performed to date as well as medications administered while in observation.  Recent changes in the last 24 hours include no acute events, FL2 was completed, TOC following  Plan  Current plan is for pending placement.    Christine Jayson LABOR, DO 08/25/24 [phone]

## 2024-08-25 NOTE — ED Provider Notes (Signed)
 Emergency Medicine Observation Re-evaluation Note  Christine Hayes is a 73 y.o. female, seen on rounds today.  Pt initially presented to the ED for complaints of slow recovery after left tka 7/21, pt indicating has struggled with rehab incl gaining normal mobility and strength mainly in LLE since her surgery. Indicates recently experienced a popping/painful sensation in knee that radiated up towards thigh - pain currently improved. No back pain/radicular pain. No new or focal numbness or weakness. Is in ED requesting rehab placement. No new c/o this AM (urine with some wbc, pt without dysuria, had noted some urinary frequency, cx pending, on keflex , no fever/chills).   Physical Exam  BP 131/80 (BP Location: Right Arm)   Pulse 79   Temp 98 F (36.7 C)   Resp 18   SpO2 94%  Physical Exam General: awake, talking on phone.  Cardiac: regular rate.  Lungs: breathing comfortably. Ext; LLE without sign of infection, surgical incision appears normal/healing. Is able to move knee without significant pain. No gross edema noted.  ED Course / MDM    I have reviewed the labs performed to date as well as medications administered while in observation.  Recent changes in the last 24 hours include ED obs, reassessment.   Plan  Current plan is for snf/rehab placement. Dispo per [facility] team.     Christine Drivers, MD 08/25/24 0730

## 2024-08-26 DIAGNOSIS — Z96652 Presence of left artificial knee joint: Secondary | ICD-10-CM | POA: Diagnosis not present

## 2024-08-26 DIAGNOSIS — I10 Essential (primary) hypertension: Secondary | ICD-10-CM | POA: Diagnosis not present

## 2024-08-26 DIAGNOSIS — R531 Weakness: Secondary | ICD-10-CM | POA: Diagnosis not present

## 2024-08-26 DIAGNOSIS — Z7401 Bed confinement status: Secondary | ICD-10-CM | POA: Diagnosis not present

## 2024-08-26 DIAGNOSIS — Z96653 Presence of artificial knee joint, bilateral: Secondary | ICD-10-CM | POA: Diagnosis not present

## 2024-08-26 DIAGNOSIS — M25462 Effusion, left knee: Secondary | ICD-10-CM | POA: Diagnosis not present

## 2024-08-26 DIAGNOSIS — I119 Hypertensive heart disease without heart failure: Secondary | ICD-10-CM | POA: Diagnosis not present

## 2024-08-26 DIAGNOSIS — G629 Polyneuropathy, unspecified: Secondary | ICD-10-CM | POA: Diagnosis not present

## 2024-08-26 DIAGNOSIS — Z79899 Other long term (current) drug therapy: Secondary | ICD-10-CM | POA: Diagnosis not present

## 2024-08-26 DIAGNOSIS — M25562 Pain in left knee: Secondary | ICD-10-CM | POA: Diagnosis not present

## 2024-08-26 DIAGNOSIS — Z7982 Long term (current) use of aspirin: Secondary | ICD-10-CM | POA: Diagnosis not present

## 2024-08-26 DIAGNOSIS — G20C Parkinsonism, unspecified: Secondary | ICD-10-CM | POA: Diagnosis not present

## 2024-08-26 DIAGNOSIS — M810 Age-related osteoporosis without current pathological fracture: Secondary | ICD-10-CM | POA: Diagnosis not present

## 2024-08-26 DIAGNOSIS — G20A1 Parkinson's disease without dyskinesia, without mention of fluctuations: Secondary | ICD-10-CM | POA: Diagnosis not present

## 2024-08-26 DIAGNOSIS — M25561 Pain in right knee: Secondary | ICD-10-CM | POA: Diagnosis not present

## 2024-08-26 DIAGNOSIS — R262 Difficulty in walking, not elsewhere classified: Secondary | ICD-10-CM | POA: Diagnosis not present

## 2024-08-26 DIAGNOSIS — Z96651 Presence of right artificial knee joint: Secondary | ICD-10-CM | POA: Diagnosis not present

## 2024-08-26 LAB — URINE CULTURE

## 2024-08-26 NOTE — ED Provider Notes (Signed)
 Emergency Medicine Observation Re-evaluation Note  Christine Hayes is a 73 y.o. female, seen on rounds today.  Pt initially presented to the ED for complaints of Bil Leg decreased Movement Currently, the patient is calm, cooperative. Resting comfortably. No acute events overnight.   Physical Exam  BP 124/81   Pulse 86   Temp 98 F (36.7 C) (Oral)   Resp 17   SpO2 97%  Physical Exam General: NAD Lungs: No respiratory distress Psych: Calm, cooperative  ED Course / MDM  EKG:   I have reviewed the labs performed to date as well as medications administered while in observation.  Recent changes in the last 24 hours include TOC consult.  Plan  Current plan is for pending SNF/rehab placement and TOC consult.     Gennaro Duwaine CROME, DO 08/26/24 [phone]

## 2024-08-26 NOTE — ED Notes (Signed)
 All paperwork given to [facility] ambulance

## 2024-08-26 NOTE — Progress Notes (Signed)
 Pt seen for provision of handout for resources for long term management and support regarding her PD. Reviewed handout and encouraged pt once out of rehabilitation to follow up with resources for further education regarding disease progression and exercise recommendations.    Elma JONETTA Lebron FREDERICK, OTR/L [facility] Acute Rehabilitation Office: [phone]

## 2024-08-26 NOTE — Discharge Instructions (Signed)
 You are being discharged to [facility] in [place] for short term rehab.

## 2024-08-26 NOTE — Progress Notes (Addendum)
 CSW spoke with Randine at [facility] who confirms patient can be accepted at the facility today. Randine requesting patient arrive after 12pm.  Patient will go to room 704 at [facility] in [place] via [facility] - pickup has been scheduled for 11:30am The number to call for report is [phone], ext. [phone].  Niels Portugal, MSW, LCSW Transitions of Care  Clinical Social Worker II [phone]

## 2024-08-26 NOTE — Progress Notes (Addendum)
 CSW outreached to [place] at [facility] to inquire about admit time. Awaiting response.   Addend @ 8:43 AM Pt can admit after 12pm. EDP and RN notified via secure chat. Report info provided to RN. Pt notified via phone.

## 2024-08-27 DIAGNOSIS — R262 Difficulty in walking, not elsewhere classified: Secondary | ICD-10-CM | POA: Diagnosis not present

## 2024-08-27 DIAGNOSIS — I119 Hypertensive heart disease without heart failure: Secondary | ICD-10-CM | POA: Diagnosis not present

## 2024-08-27 DIAGNOSIS — M25562 Pain in left knee: Secondary | ICD-10-CM | POA: Diagnosis not present

## 2024-08-27 DIAGNOSIS — M25561 Pain in right knee: Secondary | ICD-10-CM | POA: Diagnosis not present

## 2024-08-29 ENCOUNTER — Telehealth: Payer: Self-pay | Admitting: Diagnostic Neuroimaging

## 2024-08-29 MED ORDER — CARBIDOPA-LEVODOPA 25-100 MG PO TABS: 2.0000 | ORAL_TABLET | Freq: Three times a day (TID) | ORAL | 0 refills | Status: DC

## 2024-08-29 NOTE — Telephone Encounter (Signed)
Refill sent for the pt

## 2024-08-29 NOTE — Telephone Encounter (Signed)
 Pt has scheduled her 1 yr f/u, she is also on wait list, she is requesting a refill on her carbidopa -levodopa  (SINEMET  IR) 25-100 MG tablet  to [facility]

## 2024-09-10 ENCOUNTER — Telehealth: Payer: Self-pay | Admitting: Diagnostic Neuroimaging

## 2024-09-10 DIAGNOSIS — G8929 Other chronic pain: Secondary | ICD-10-CM | POA: Diagnosis not present

## 2024-09-10 DIAGNOSIS — E785 Hyperlipidemia, unspecified: Secondary | ICD-10-CM | POA: Diagnosis not present

## 2024-09-10 DIAGNOSIS — Z87440 Personal history of urinary (tract) infections: Secondary | ICD-10-CM | POA: Diagnosis not present

## 2024-09-10 DIAGNOSIS — F419 Anxiety disorder, unspecified: Secondary | ICD-10-CM | POA: Diagnosis not present

## 2024-09-10 DIAGNOSIS — M1990 Unspecified osteoarthritis, unspecified site: Secondary | ICD-10-CM | POA: Diagnosis not present

## 2024-09-10 DIAGNOSIS — M519 Unspecified thoracic, thoracolumbar and lumbosacral intervertebral disc disorder: Secondary | ICD-10-CM | POA: Diagnosis not present

## 2024-09-10 DIAGNOSIS — G4700 Insomnia, unspecified: Secondary | ICD-10-CM | POA: Diagnosis not present

## 2024-09-10 DIAGNOSIS — G20A1 Parkinson's disease without dyskinesia, without mention of fluctuations: Secondary | ICD-10-CM | POA: Diagnosis not present

## 2024-09-10 DIAGNOSIS — Z9181 History of falling: Secondary | ICD-10-CM | POA: Diagnosis not present

## 2024-09-10 DIAGNOSIS — Z7982 Long term (current) use of aspirin: Secondary | ICD-10-CM | POA: Diagnosis not present

## 2024-09-10 DIAGNOSIS — I739 Peripheral vascular disease, unspecified: Secondary | ICD-10-CM | POA: Diagnosis not present

## 2024-09-10 DIAGNOSIS — Z859 Personal history of malignant neoplasm, unspecified: Secondary | ICD-10-CM | POA: Diagnosis not present

## 2024-09-10 DIAGNOSIS — E756 Lipid storage disorder, unspecified: Secondary | ICD-10-CM | POA: Diagnosis not present

## 2024-09-10 DIAGNOSIS — M810 Age-related osteoporosis without current pathological fracture: Secondary | ICD-10-CM | POA: Diagnosis not present

## 2024-09-10 DIAGNOSIS — Z96653 Presence of artificial knee joint, bilateral: Secondary | ICD-10-CM | POA: Diagnosis not present

## 2024-09-10 DIAGNOSIS — G629 Polyneuropathy, unspecified: Secondary | ICD-10-CM | POA: Diagnosis not present

## 2024-09-10 DIAGNOSIS — I10 Essential (primary) hypertension: Secondary | ICD-10-CM | POA: Diagnosis not present

## 2024-09-10 NOTE — Telephone Encounter (Signed)
 Pt is asking for a call to discuss concern about a slight tremor in right leg and slow movement in getting dressed.  Pt also mentioned she feels nervous on the inside almost as if she is going to have a panic attack, please call to discuss, pt open to changes in medication if need be.

## 2024-09-10 NOTE — Telephone Encounter (Signed)
 Spoke w/Pt regarding message. Pt stated that since her Rt knee replacement in May 2024, her knee just hasn't been right. She reported that when she is sitting down, if her leg isn't supported by something, it tremors. Pt also stated she is slow when getting dressed. Pt stated she had Lt knee replacement I July 2025 and it has gone much better. She went to rehab for 12 days and received PT and OT and HH PT & OT will begin working with her next week. Discussed that Rt leg issues could be from her dx but also the PT can be helpful for strengthening. Pt discussed the nervous feeling she gets inside when she is anxious or in anticipation of something. Discussed this could be due to anxiety and to discuss with her PCP. Informed Pt will send message to provider regarding the issues with her Rt leg tremors and if recommendations are made we will reach out to her. Pt voiced understanding and thanks for the call back.

## 2024-09-11 DIAGNOSIS — Z471 Aftercare following joint replacement surgery: Secondary | ICD-10-CM | POA: Diagnosis not present

## 2024-09-11 DIAGNOSIS — Z96651 Presence of right artificial knee joint: Secondary | ICD-10-CM | POA: Diagnosis not present

## 2024-09-12 DIAGNOSIS — M519 Unspecified thoracic, thoracolumbar and lumbosacral intervertebral disc disorder: Secondary | ICD-10-CM | POA: Diagnosis not present

## 2024-09-12 DIAGNOSIS — Z9181 History of falling: Secondary | ICD-10-CM | POA: Diagnosis not present

## 2024-09-12 DIAGNOSIS — I10 Essential (primary) hypertension: Secondary | ICD-10-CM | POA: Diagnosis not present

## 2024-09-12 DIAGNOSIS — E785 Hyperlipidemia, unspecified: Secondary | ICD-10-CM | POA: Diagnosis not present

## 2024-09-12 DIAGNOSIS — E756 Lipid storage disorder, unspecified: Secondary | ICD-10-CM | POA: Diagnosis not present

## 2024-09-12 DIAGNOSIS — G4700 Insomnia, unspecified: Secondary | ICD-10-CM | POA: Diagnosis not present

## 2024-09-12 DIAGNOSIS — Z87440 Personal history of urinary (tract) infections: Secondary | ICD-10-CM | POA: Diagnosis not present

## 2024-09-12 DIAGNOSIS — Z96653 Presence of artificial knee joint, bilateral: Secondary | ICD-10-CM | POA: Diagnosis not present

## 2024-09-12 DIAGNOSIS — G629 Polyneuropathy, unspecified: Secondary | ICD-10-CM | POA: Diagnosis not present

## 2024-09-12 DIAGNOSIS — M1990 Unspecified osteoarthritis, unspecified site: Secondary | ICD-10-CM | POA: Diagnosis not present

## 2024-09-12 DIAGNOSIS — Z859 Personal history of malignant neoplasm, unspecified: Secondary | ICD-10-CM | POA: Diagnosis not present

## 2024-09-12 DIAGNOSIS — G20A1 Parkinson's disease without dyskinesia, without mention of fluctuations: Secondary | ICD-10-CM | POA: Diagnosis not present

## 2024-09-12 DIAGNOSIS — F419 Anxiety disorder, unspecified: Secondary | ICD-10-CM | POA: Diagnosis not present

## 2024-09-12 DIAGNOSIS — Z7982 Long term (current) use of aspirin: Secondary | ICD-10-CM | POA: Diagnosis not present

## 2024-09-12 DIAGNOSIS — G8929 Other chronic pain: Secondary | ICD-10-CM | POA: Diagnosis not present

## 2024-09-12 DIAGNOSIS — M810 Age-related osteoporosis without current pathological fracture: Secondary | ICD-10-CM | POA: Diagnosis not present

## 2024-09-12 DIAGNOSIS — I739 Peripheral vascular disease, unspecified: Secondary | ICD-10-CM | POA: Diagnosis not present

## 2024-09-13 DIAGNOSIS — Z471 Aftercare following joint replacement surgery: Secondary | ICD-10-CM | POA: Diagnosis not present

## 2024-09-13 DIAGNOSIS — Z96652 Presence of left artificial knee joint: Secondary | ICD-10-CM | POA: Diagnosis not present

## 2024-09-16 DIAGNOSIS — I10 Essential (primary) hypertension: Secondary | ICD-10-CM | POA: Diagnosis not present

## 2024-09-16 DIAGNOSIS — I739 Peripheral vascular disease, unspecified: Secondary | ICD-10-CM | POA: Diagnosis not present

## 2024-09-16 DIAGNOSIS — Z9181 History of falling: Secondary | ICD-10-CM | POA: Diagnosis not present

## 2024-09-16 DIAGNOSIS — Z96653 Presence of artificial knee joint, bilateral: Secondary | ICD-10-CM | POA: Diagnosis not present

## 2024-09-16 DIAGNOSIS — G8929 Other chronic pain: Secondary | ICD-10-CM | POA: Diagnosis not present

## 2024-09-16 DIAGNOSIS — G629 Polyneuropathy, unspecified: Secondary | ICD-10-CM | POA: Diagnosis not present

## 2024-09-16 DIAGNOSIS — G20A1 Parkinson's disease without dyskinesia, without mention of fluctuations: Secondary | ICD-10-CM | POA: Diagnosis not present

## 2024-09-16 DIAGNOSIS — G4700 Insomnia, unspecified: Secondary | ICD-10-CM | POA: Diagnosis not present

## 2024-09-16 DIAGNOSIS — M1990 Unspecified osteoarthritis, unspecified site: Secondary | ICD-10-CM | POA: Diagnosis not present

## 2024-09-16 DIAGNOSIS — Z859 Personal history of malignant neoplasm, unspecified: Secondary | ICD-10-CM | POA: Diagnosis not present

## 2024-09-16 DIAGNOSIS — E785 Hyperlipidemia, unspecified: Secondary | ICD-10-CM | POA: Diagnosis not present

## 2024-09-16 DIAGNOSIS — Z87440 Personal history of urinary (tract) infections: Secondary | ICD-10-CM | POA: Diagnosis not present

## 2024-09-16 DIAGNOSIS — M810 Age-related osteoporosis without current pathological fracture: Secondary | ICD-10-CM | POA: Diagnosis not present

## 2024-09-16 DIAGNOSIS — E756 Lipid storage disorder, unspecified: Secondary | ICD-10-CM | POA: Diagnosis not present

## 2024-09-16 DIAGNOSIS — F419 Anxiety disorder, unspecified: Secondary | ICD-10-CM | POA: Diagnosis not present

## 2024-09-16 DIAGNOSIS — Z7982 Long term (current) use of aspirin: Secondary | ICD-10-CM | POA: Diagnosis not present

## 2024-09-16 DIAGNOSIS — M519 Unspecified thoracic, thoracolumbar and lumbosacral intervertebral disc disorder: Secondary | ICD-10-CM | POA: Diagnosis not present

## 2024-09-17 DIAGNOSIS — Z96653 Presence of artificial knee joint, bilateral: Secondary | ICD-10-CM | POA: Diagnosis not present

## 2024-09-17 DIAGNOSIS — I10 Essential (primary) hypertension: Secondary | ICD-10-CM | POA: Diagnosis not present

## 2024-09-17 DIAGNOSIS — G4700 Insomnia, unspecified: Secondary | ICD-10-CM | POA: Diagnosis not present

## 2024-09-17 DIAGNOSIS — E756 Lipid storage disorder, unspecified: Secondary | ICD-10-CM | POA: Diagnosis not present

## 2024-09-17 DIAGNOSIS — G20A1 Parkinson's disease without dyskinesia, without mention of fluctuations: Secondary | ICD-10-CM | POA: Diagnosis not present

## 2024-09-17 DIAGNOSIS — G8929 Other chronic pain: Secondary | ICD-10-CM | POA: Diagnosis not present

## 2024-09-17 DIAGNOSIS — F419 Anxiety disorder, unspecified: Secondary | ICD-10-CM | POA: Diagnosis not present

## 2024-09-17 DIAGNOSIS — Z7982 Long term (current) use of aspirin: Secondary | ICD-10-CM | POA: Diagnosis not present

## 2024-09-17 DIAGNOSIS — M519 Unspecified thoracic, thoracolumbar and lumbosacral intervertebral disc disorder: Secondary | ICD-10-CM | POA: Diagnosis not present

## 2024-09-17 DIAGNOSIS — E785 Hyperlipidemia, unspecified: Secondary | ICD-10-CM | POA: Diagnosis not present

## 2024-09-17 DIAGNOSIS — M810 Age-related osteoporosis without current pathological fracture: Secondary | ICD-10-CM | POA: Diagnosis not present

## 2024-09-17 DIAGNOSIS — M1990 Unspecified osteoarthritis, unspecified site: Secondary | ICD-10-CM | POA: Diagnosis not present

## 2024-09-17 DIAGNOSIS — Z87440 Personal history of urinary (tract) infections: Secondary | ICD-10-CM | POA: Diagnosis not present

## 2024-09-17 DIAGNOSIS — Z9181 History of falling: Secondary | ICD-10-CM | POA: Diagnosis not present

## 2024-09-17 DIAGNOSIS — I739 Peripheral vascular disease, unspecified: Secondary | ICD-10-CM | POA: Diagnosis not present

## 2024-09-17 DIAGNOSIS — Z859 Personal history of malignant neoplasm, unspecified: Secondary | ICD-10-CM | POA: Diagnosis not present

## 2024-09-17 DIAGNOSIS — G629 Polyneuropathy, unspecified: Secondary | ICD-10-CM | POA: Diagnosis not present

## 2024-09-17 NOTE — Telephone Encounter (Signed)
 Pt has called to report that she has a MRI on this coming Sunday, she'd like to know if she should keep tomorrow's appointment, please advise.

## 2024-09-17 NOTE — Telephone Encounter (Addendum)
 Phone rep called pt, relayed response from Dr Margaret. Pt accepted a f/u appointment slot with Harlene, NP.  Pt is aware to arrive at 1:15 for the 1:45 appointment on 09-18-24

## 2024-09-17 NOTE — Telephone Encounter (Signed)
 Called and relayed the information to pt and she voiced gratitude and understanding

## 2024-09-17 NOTE — Telephone Encounter (Signed)
 Spoke w/Pt who stated the tremors have lessened. The MRI is of the lumbar spine ordered by ortho because she is having other issues. Pt stated she is not getting around too well and thought maybe she should wait until MRI is resulted. Pt stated she will get results of MRI on Monday from ortho. Informed Pt will send message to Dr. Margaret and if he wants to see her after the MRI is resulted we will reach out to her. Otherwise Pt has appt with NP Lomax on 11/08/24. Pt voiced understanding and thanks for the follow up call.

## 2024-09-18 ENCOUNTER — Ambulatory Visit: Admitting: Adult Health

## 2024-09-18 DIAGNOSIS — F419 Anxiety disorder, unspecified: Secondary | ICD-10-CM | POA: Diagnosis not present

## 2024-09-18 DIAGNOSIS — M1990 Unspecified osteoarthritis, unspecified site: Secondary | ICD-10-CM | POA: Diagnosis not present

## 2024-09-18 DIAGNOSIS — Z7982 Long term (current) use of aspirin: Secondary | ICD-10-CM | POA: Diagnosis not present

## 2024-09-18 DIAGNOSIS — Z859 Personal history of malignant neoplasm, unspecified: Secondary | ICD-10-CM | POA: Diagnosis not present

## 2024-09-18 DIAGNOSIS — G629 Polyneuropathy, unspecified: Secondary | ICD-10-CM | POA: Diagnosis not present

## 2024-09-18 DIAGNOSIS — E756 Lipid storage disorder, unspecified: Secondary | ICD-10-CM | POA: Diagnosis not present

## 2024-09-18 DIAGNOSIS — E785 Hyperlipidemia, unspecified: Secondary | ICD-10-CM | POA: Diagnosis not present

## 2024-09-18 DIAGNOSIS — I10 Essential (primary) hypertension: Secondary | ICD-10-CM | POA: Diagnosis not present

## 2024-09-18 DIAGNOSIS — Z87440 Personal history of urinary (tract) infections: Secondary | ICD-10-CM | POA: Diagnosis not present

## 2024-09-18 DIAGNOSIS — G4700 Insomnia, unspecified: Secondary | ICD-10-CM | POA: Diagnosis not present

## 2024-09-18 DIAGNOSIS — I739 Peripheral vascular disease, unspecified: Secondary | ICD-10-CM | POA: Diagnosis not present

## 2024-09-18 DIAGNOSIS — G20A1 Parkinson's disease without dyskinesia, without mention of fluctuations: Secondary | ICD-10-CM | POA: Diagnosis not present

## 2024-09-18 DIAGNOSIS — Z9181 History of falling: Secondary | ICD-10-CM | POA: Diagnosis not present

## 2024-09-18 DIAGNOSIS — M810 Age-related osteoporosis without current pathological fracture: Secondary | ICD-10-CM | POA: Diagnosis not present

## 2024-09-18 DIAGNOSIS — Z96653 Presence of artificial knee joint, bilateral: Secondary | ICD-10-CM | POA: Diagnosis not present

## 2024-09-18 DIAGNOSIS — M519 Unspecified thoracic, thoracolumbar and lumbosacral intervertebral disc disorder: Secondary | ICD-10-CM | POA: Diagnosis not present

## 2024-09-18 DIAGNOSIS — G8929 Other chronic pain: Secondary | ICD-10-CM | POA: Diagnosis not present

## 2024-09-22 DIAGNOSIS — M48061 Spinal stenosis, lumbar region without neurogenic claudication: Secondary | ICD-10-CM | POA: Diagnosis not present

## 2024-09-22 DIAGNOSIS — M47816 Spondylosis without myelopathy or radiculopathy, lumbar region: Secondary | ICD-10-CM | POA: Diagnosis not present

## 2024-09-22 DIAGNOSIS — M5126 Other intervertebral disc displacement, lumbar region: Secondary | ICD-10-CM | POA: Diagnosis not present

## 2024-09-22 DIAGNOSIS — M51369 Other intervertebral disc degeneration, lumbar region without mention of lumbar back pain or lower extremity pain: Secondary | ICD-10-CM | POA: Diagnosis not present

## 2024-09-23 DIAGNOSIS — R29898 Other symptoms and signs involving the musculoskeletal system: Secondary | ICD-10-CM | POA: Diagnosis not present

## 2024-09-24 NOTE — Telephone Encounter (Signed)
 Patient called to let you know got MRI Sunday, 09/22/24 and have gotten results back. Have a copy of report for Dr. Margaret. Would like a call back to discuss if can get an earlier appointment.

## 2024-09-25 ENCOUNTER — Telehealth: Payer: Self-pay | Admitting: Diagnostic Neuroimaging

## 2024-09-25 DIAGNOSIS — I10 Essential (primary) hypertension: Secondary | ICD-10-CM | POA: Diagnosis not present

## 2024-09-25 DIAGNOSIS — Z96653 Presence of artificial knee joint, bilateral: Secondary | ICD-10-CM | POA: Diagnosis not present

## 2024-09-25 DIAGNOSIS — Z859 Personal history of malignant neoplasm, unspecified: Secondary | ICD-10-CM | POA: Diagnosis not present

## 2024-09-25 DIAGNOSIS — G20A1 Parkinson's disease without dyskinesia, without mention of fluctuations: Secondary | ICD-10-CM | POA: Diagnosis not present

## 2024-09-25 DIAGNOSIS — Z7982 Long term (current) use of aspirin: Secondary | ICD-10-CM | POA: Diagnosis not present

## 2024-09-25 DIAGNOSIS — M810 Age-related osteoporosis without current pathological fracture: Secondary | ICD-10-CM | POA: Diagnosis not present

## 2024-09-25 DIAGNOSIS — F419 Anxiety disorder, unspecified: Secondary | ICD-10-CM | POA: Diagnosis not present

## 2024-09-25 DIAGNOSIS — M519 Unspecified thoracic, thoracolumbar and lumbosacral intervertebral disc disorder: Secondary | ICD-10-CM | POA: Diagnosis not present

## 2024-09-25 DIAGNOSIS — I739 Peripheral vascular disease, unspecified: Secondary | ICD-10-CM | POA: Diagnosis not present

## 2024-09-25 DIAGNOSIS — M1990 Unspecified osteoarthritis, unspecified site: Secondary | ICD-10-CM | POA: Diagnosis not present

## 2024-09-25 DIAGNOSIS — E785 Hyperlipidemia, unspecified: Secondary | ICD-10-CM | POA: Diagnosis not present

## 2024-09-25 DIAGNOSIS — Z9181 History of falling: Secondary | ICD-10-CM | POA: Diagnosis not present

## 2024-09-25 DIAGNOSIS — Z87440 Personal history of urinary (tract) infections: Secondary | ICD-10-CM | POA: Diagnosis not present

## 2024-09-25 DIAGNOSIS — G8929 Other chronic pain: Secondary | ICD-10-CM | POA: Diagnosis not present

## 2024-09-25 DIAGNOSIS — G4700 Insomnia, unspecified: Secondary | ICD-10-CM | POA: Diagnosis not present

## 2024-09-25 DIAGNOSIS — G629 Polyneuropathy, unspecified: Secondary | ICD-10-CM | POA: Diagnosis not present

## 2024-09-25 DIAGNOSIS — E756 Lipid storage disorder, unspecified: Secondary | ICD-10-CM | POA: Diagnosis not present

## 2024-09-25 NOTE — Telephone Encounter (Signed)
 LVM at 12:36 pm [facility]) have some concerns about her legs giving way. She been experiencing blue she has left. Let her provider know her appointment not until November. She has basically gone from being ambulatory to wheelchair because of weakness in the legs. Feel there maybe a neurologic component to this. Would appreciate a call back to discuss or there is a sooner appointment for her, that would be in her best interest.

## 2024-09-26 DIAGNOSIS — G4700 Insomnia, unspecified: Secondary | ICD-10-CM | POA: Diagnosis not present

## 2024-09-26 DIAGNOSIS — Z9181 History of falling: Secondary | ICD-10-CM | POA: Diagnosis not present

## 2024-09-26 DIAGNOSIS — G20A1 Parkinson's disease without dyskinesia, without mention of fluctuations: Secondary | ICD-10-CM | POA: Diagnosis not present

## 2024-09-26 DIAGNOSIS — Z7982 Long term (current) use of aspirin: Secondary | ICD-10-CM | POA: Diagnosis not present

## 2024-09-26 DIAGNOSIS — M810 Age-related osteoporosis without current pathological fracture: Secondary | ICD-10-CM | POA: Diagnosis not present

## 2024-09-26 DIAGNOSIS — E785 Hyperlipidemia, unspecified: Secondary | ICD-10-CM | POA: Diagnosis not present

## 2024-09-26 DIAGNOSIS — M1990 Unspecified osteoarthritis, unspecified site: Secondary | ICD-10-CM | POA: Diagnosis not present

## 2024-09-26 DIAGNOSIS — Z87440 Personal history of urinary (tract) infections: Secondary | ICD-10-CM | POA: Diagnosis not present

## 2024-09-26 DIAGNOSIS — G629 Polyneuropathy, unspecified: Secondary | ICD-10-CM | POA: Diagnosis not present

## 2024-09-26 DIAGNOSIS — F419 Anxiety disorder, unspecified: Secondary | ICD-10-CM | POA: Diagnosis not present

## 2024-09-26 DIAGNOSIS — I739 Peripheral vascular disease, unspecified: Secondary | ICD-10-CM | POA: Diagnosis not present

## 2024-09-26 DIAGNOSIS — Z859 Personal history of malignant neoplasm, unspecified: Secondary | ICD-10-CM | POA: Diagnosis not present

## 2024-09-26 DIAGNOSIS — Z96653 Presence of artificial knee joint, bilateral: Secondary | ICD-10-CM | POA: Diagnosis not present

## 2024-09-26 DIAGNOSIS — I10 Essential (primary) hypertension: Secondary | ICD-10-CM | POA: Diagnosis not present

## 2024-09-26 DIAGNOSIS — E756 Lipid storage disorder, unspecified: Secondary | ICD-10-CM | POA: Diagnosis not present

## 2024-09-26 DIAGNOSIS — G8929 Other chronic pain: Secondary | ICD-10-CM | POA: Diagnosis not present

## 2024-09-26 DIAGNOSIS — M519 Unspecified thoracic, thoracolumbar and lumbosacral intervertebral disc disorder: Secondary | ICD-10-CM | POA: Diagnosis not present

## 2024-09-26 NOTE — Telephone Encounter (Signed)
 Spoke w/Christine Hayes with [facility] regarding phone msg. Discussed w/Christine Hayes Dr. Margaret is aware of Pt issues and that she had MRI ordered by ortho and Pt has an appt with NP for review of MRI and to note if anything further is needed from a neurological standpoint. We will attempt to get Pt in to be seen sooner by NP or MD. Christine Hayes voiced understanding and will discuss w/Pt at next visit. Christine Hayes voiced thanks for the call back.

## 2024-09-26 NOTE — Telephone Encounter (Signed)
 Pt is scheduled to see NP 11/08/24.

## 2024-09-27 DIAGNOSIS — G629 Polyneuropathy, unspecified: Secondary | ICD-10-CM | POA: Diagnosis not present

## 2024-09-27 DIAGNOSIS — Z9181 History of falling: Secondary | ICD-10-CM | POA: Diagnosis not present

## 2024-09-27 DIAGNOSIS — G8929 Other chronic pain: Secondary | ICD-10-CM | POA: Diagnosis not present

## 2024-09-27 DIAGNOSIS — Z96653 Presence of artificial knee joint, bilateral: Secondary | ICD-10-CM | POA: Diagnosis not present

## 2024-09-27 DIAGNOSIS — I739 Peripheral vascular disease, unspecified: Secondary | ICD-10-CM | POA: Diagnosis not present

## 2024-09-27 DIAGNOSIS — M810 Age-related osteoporosis without current pathological fracture: Secondary | ICD-10-CM | POA: Diagnosis not present

## 2024-09-27 DIAGNOSIS — E785 Hyperlipidemia, unspecified: Secondary | ICD-10-CM | POA: Diagnosis not present

## 2024-09-27 DIAGNOSIS — Z7982 Long term (current) use of aspirin: Secondary | ICD-10-CM | POA: Diagnosis not present

## 2024-09-27 DIAGNOSIS — M1990 Unspecified osteoarthritis, unspecified site: Secondary | ICD-10-CM | POA: Diagnosis not present

## 2024-09-27 DIAGNOSIS — F419 Anxiety disorder, unspecified: Secondary | ICD-10-CM | POA: Diagnosis not present

## 2024-09-27 DIAGNOSIS — I10 Essential (primary) hypertension: Secondary | ICD-10-CM | POA: Diagnosis not present

## 2024-09-27 DIAGNOSIS — Z87440 Personal history of urinary (tract) infections: Secondary | ICD-10-CM | POA: Diagnosis not present

## 2024-09-27 DIAGNOSIS — M519 Unspecified thoracic, thoracolumbar and lumbosacral intervertebral disc disorder: Secondary | ICD-10-CM | POA: Diagnosis not present

## 2024-09-27 DIAGNOSIS — G20A1 Parkinson's disease without dyskinesia, without mention of fluctuations: Secondary | ICD-10-CM | POA: Diagnosis not present

## 2024-09-27 DIAGNOSIS — G4700 Insomnia, unspecified: Secondary | ICD-10-CM | POA: Diagnosis not present

## 2024-09-27 DIAGNOSIS — E756 Lipid storage disorder, unspecified: Secondary | ICD-10-CM | POA: Diagnosis not present

## 2024-09-27 DIAGNOSIS — Z859 Personal history of malignant neoplasm, unspecified: Secondary | ICD-10-CM | POA: Diagnosis not present

## 2024-10-01 DIAGNOSIS — M1990 Unspecified osteoarthritis, unspecified site: Secondary | ICD-10-CM | POA: Diagnosis not present

## 2024-10-01 DIAGNOSIS — Z87440 Personal history of urinary (tract) infections: Secondary | ICD-10-CM | POA: Diagnosis not present

## 2024-10-01 DIAGNOSIS — Z9181 History of falling: Secondary | ICD-10-CM | POA: Diagnosis not present

## 2024-10-01 DIAGNOSIS — Z859 Personal history of malignant neoplasm, unspecified: Secondary | ICD-10-CM | POA: Diagnosis not present

## 2024-10-01 DIAGNOSIS — Z96653 Presence of artificial knee joint, bilateral: Secondary | ICD-10-CM | POA: Diagnosis not present

## 2024-10-01 DIAGNOSIS — E785 Hyperlipidemia, unspecified: Secondary | ICD-10-CM | POA: Diagnosis not present

## 2024-10-01 DIAGNOSIS — G20A1 Parkinson's disease without dyskinesia, without mention of fluctuations: Secondary | ICD-10-CM | POA: Diagnosis not present

## 2024-10-01 DIAGNOSIS — G629 Polyneuropathy, unspecified: Secondary | ICD-10-CM | POA: Diagnosis not present

## 2024-10-01 DIAGNOSIS — I739 Peripheral vascular disease, unspecified: Secondary | ICD-10-CM | POA: Diagnosis not present

## 2024-10-01 DIAGNOSIS — G8929 Other chronic pain: Secondary | ICD-10-CM | POA: Diagnosis not present

## 2024-10-01 DIAGNOSIS — E756 Lipid storage disorder, unspecified: Secondary | ICD-10-CM | POA: Diagnosis not present

## 2024-10-01 DIAGNOSIS — G4700 Insomnia, unspecified: Secondary | ICD-10-CM | POA: Diagnosis not present

## 2024-10-01 DIAGNOSIS — M519 Unspecified thoracic, thoracolumbar and lumbosacral intervertebral disc disorder: Secondary | ICD-10-CM | POA: Diagnosis not present

## 2024-10-01 DIAGNOSIS — F419 Anxiety disorder, unspecified: Secondary | ICD-10-CM | POA: Diagnosis not present

## 2024-10-01 DIAGNOSIS — I10 Essential (primary) hypertension: Secondary | ICD-10-CM | POA: Diagnosis not present

## 2024-10-01 DIAGNOSIS — M810 Age-related osteoporosis without current pathological fracture: Secondary | ICD-10-CM | POA: Diagnosis not present

## 2024-10-01 DIAGNOSIS — Z7982 Long term (current) use of aspirin: Secondary | ICD-10-CM | POA: Diagnosis not present

## 2024-10-04 DIAGNOSIS — E756 Lipid storage disorder, unspecified: Secondary | ICD-10-CM | POA: Diagnosis not present

## 2024-10-04 DIAGNOSIS — E785 Hyperlipidemia, unspecified: Secondary | ICD-10-CM | POA: Diagnosis not present

## 2024-10-04 DIAGNOSIS — Z96653 Presence of artificial knee joint, bilateral: Secondary | ICD-10-CM | POA: Diagnosis not present

## 2024-10-04 DIAGNOSIS — G20A1 Parkinson's disease without dyskinesia, without mention of fluctuations: Secondary | ICD-10-CM | POA: Diagnosis not present

## 2024-10-04 DIAGNOSIS — Z7982 Long term (current) use of aspirin: Secondary | ICD-10-CM | POA: Diagnosis not present

## 2024-10-04 DIAGNOSIS — M519 Unspecified thoracic, thoracolumbar and lumbosacral intervertebral disc disorder: Secondary | ICD-10-CM | POA: Diagnosis not present

## 2024-10-04 DIAGNOSIS — I10 Essential (primary) hypertension: Secondary | ICD-10-CM | POA: Diagnosis not present

## 2024-10-04 DIAGNOSIS — Z859 Personal history of malignant neoplasm, unspecified: Secondary | ICD-10-CM | POA: Diagnosis not present

## 2024-10-04 DIAGNOSIS — F419 Anxiety disorder, unspecified: Secondary | ICD-10-CM | POA: Diagnosis not present

## 2024-10-04 DIAGNOSIS — G4700 Insomnia, unspecified: Secondary | ICD-10-CM | POA: Diagnosis not present

## 2024-10-04 DIAGNOSIS — G8929 Other chronic pain: Secondary | ICD-10-CM | POA: Diagnosis not present

## 2024-10-04 DIAGNOSIS — G629 Polyneuropathy, unspecified: Secondary | ICD-10-CM | POA: Diagnosis not present

## 2024-10-04 DIAGNOSIS — M1990 Unspecified osteoarthritis, unspecified site: Secondary | ICD-10-CM | POA: Diagnosis not present

## 2024-10-04 DIAGNOSIS — Z9181 History of falling: Secondary | ICD-10-CM | POA: Diagnosis not present

## 2024-10-04 DIAGNOSIS — Z87440 Personal history of urinary (tract) infections: Secondary | ICD-10-CM | POA: Diagnosis not present

## 2024-10-04 DIAGNOSIS — I739 Peripheral vascular disease, unspecified: Secondary | ICD-10-CM | POA: Diagnosis not present

## 2024-10-04 DIAGNOSIS — M810 Age-related osteoporosis without current pathological fracture: Secondary | ICD-10-CM | POA: Diagnosis not present

## 2024-10-08 DIAGNOSIS — G20A1 Parkinson's disease without dyskinesia, without mention of fluctuations: Secondary | ICD-10-CM | POA: Diagnosis not present

## 2024-10-08 DIAGNOSIS — I10 Essential (primary) hypertension: Secondary | ICD-10-CM | POA: Diagnosis not present

## 2024-10-08 DIAGNOSIS — Z9181 History of falling: Secondary | ICD-10-CM | POA: Diagnosis not present

## 2024-10-08 DIAGNOSIS — G4700 Insomnia, unspecified: Secondary | ICD-10-CM | POA: Diagnosis not present

## 2024-10-08 DIAGNOSIS — Z87440 Personal history of urinary (tract) infections: Secondary | ICD-10-CM | POA: Diagnosis not present

## 2024-10-08 DIAGNOSIS — Z96653 Presence of artificial knee joint, bilateral: Secondary | ICD-10-CM | POA: Diagnosis not present

## 2024-10-08 DIAGNOSIS — I739 Peripheral vascular disease, unspecified: Secondary | ICD-10-CM | POA: Diagnosis not present

## 2024-10-08 DIAGNOSIS — M810 Age-related osteoporosis without current pathological fracture: Secondary | ICD-10-CM | POA: Diagnosis not present

## 2024-10-08 DIAGNOSIS — Z859 Personal history of malignant neoplasm, unspecified: Secondary | ICD-10-CM | POA: Diagnosis not present

## 2024-10-08 DIAGNOSIS — M1990 Unspecified osteoarthritis, unspecified site: Secondary | ICD-10-CM | POA: Diagnosis not present

## 2024-10-08 DIAGNOSIS — E785 Hyperlipidemia, unspecified: Secondary | ICD-10-CM | POA: Diagnosis not present

## 2024-10-08 DIAGNOSIS — M519 Unspecified thoracic, thoracolumbar and lumbosacral intervertebral disc disorder: Secondary | ICD-10-CM | POA: Diagnosis not present

## 2024-10-08 DIAGNOSIS — F419 Anxiety disorder, unspecified: Secondary | ICD-10-CM | POA: Diagnosis not present

## 2024-10-08 DIAGNOSIS — E756 Lipid storage disorder, unspecified: Secondary | ICD-10-CM | POA: Diagnosis not present

## 2024-10-08 DIAGNOSIS — Z7982 Long term (current) use of aspirin: Secondary | ICD-10-CM | POA: Diagnosis not present

## 2024-10-08 DIAGNOSIS — G8929 Other chronic pain: Secondary | ICD-10-CM | POA: Diagnosis not present

## 2024-10-08 DIAGNOSIS — G629 Polyneuropathy, unspecified: Secondary | ICD-10-CM | POA: Diagnosis not present

## 2024-10-09 DIAGNOSIS — G629 Polyneuropathy, unspecified: Secondary | ICD-10-CM | POA: Diagnosis not present

## 2024-10-09 DIAGNOSIS — E756 Lipid storage disorder, unspecified: Secondary | ICD-10-CM | POA: Diagnosis not present

## 2024-10-09 DIAGNOSIS — M519 Unspecified thoracic, thoracolumbar and lumbosacral intervertebral disc disorder: Secondary | ICD-10-CM | POA: Diagnosis not present

## 2024-10-09 DIAGNOSIS — G20A1 Parkinson's disease without dyskinesia, without mention of fluctuations: Secondary | ICD-10-CM | POA: Diagnosis not present

## 2024-10-09 DIAGNOSIS — M1990 Unspecified osteoarthritis, unspecified site: Secondary | ICD-10-CM | POA: Diagnosis not present

## 2024-10-09 DIAGNOSIS — I10 Essential (primary) hypertension: Secondary | ICD-10-CM | POA: Diagnosis not present

## 2024-10-09 DIAGNOSIS — F419 Anxiety disorder, unspecified: Secondary | ICD-10-CM | POA: Diagnosis not present

## 2024-10-09 DIAGNOSIS — Z7982 Long term (current) use of aspirin: Secondary | ICD-10-CM | POA: Diagnosis not present

## 2024-10-09 DIAGNOSIS — Z9181 History of falling: Secondary | ICD-10-CM | POA: Diagnosis not present

## 2024-10-09 DIAGNOSIS — Z87440 Personal history of urinary (tract) infections: Secondary | ICD-10-CM | POA: Diagnosis not present

## 2024-10-09 DIAGNOSIS — M810 Age-related osteoporosis without current pathological fracture: Secondary | ICD-10-CM | POA: Diagnosis not present

## 2024-10-09 DIAGNOSIS — Z859 Personal history of malignant neoplasm, unspecified: Secondary | ICD-10-CM | POA: Diagnosis not present

## 2024-10-09 DIAGNOSIS — I739 Peripheral vascular disease, unspecified: Secondary | ICD-10-CM | POA: Diagnosis not present

## 2024-10-09 DIAGNOSIS — G8929 Other chronic pain: Secondary | ICD-10-CM | POA: Diagnosis not present

## 2024-10-09 DIAGNOSIS — E785 Hyperlipidemia, unspecified: Secondary | ICD-10-CM | POA: Diagnosis not present

## 2024-10-09 DIAGNOSIS — G4700 Insomnia, unspecified: Secondary | ICD-10-CM | POA: Diagnosis not present

## 2024-10-09 DIAGNOSIS — Z96653 Presence of artificial knee joint, bilateral: Secondary | ICD-10-CM | POA: Diagnosis not present

## 2024-10-09 NOTE — Progress Notes (Unsigned)
 No chief complaint on file.   HISTORY OF PRESENT ILLNESS:  10/09/24 ALL:  Christine Hayes is a 73 y.o. female here today for follow up for PD. She was last seen by Dr Margaret 05/2023 and doing well. She continues carb-levo IR 2 tablets TID.   She had left TKR 07/15/2024. She had delayed recovery and prolonged weakness following. She was seen in the ER 07/2024 requesting inpatient rehab. She was admitted to [facility].   She was seen by ortho. MRI lumbar? Showed?   HISTORY (copied from Dr Chancy previous note)  UPDATE (06/19/23, VRP): Since last visit, doing well. Symptoms are stable. Some addl fatigue and slow movements, but overall doing well. Some issues with low BP after her right knee surgery in May 2024.    UPDATE (06/14/22, VRP): Since last visit, doing WELL. Symptoms are mild. Tolerating carb/levo 1.5 tabs three times a day now.      UPDATE (10/12/21, VRP): Since last visit, doing well. Symptoms are improved on carb /levo 1 tab three times a day (better movements, less tremor). No on - off issues. Gait stable. No side effects.    PRIOR HPI (07/12/21): 73 year old female here for evaluation of right hand tremor.  Symptoms started in December 2021.  She noted intermittent brief tremor in her right hand and fingers when she is using a pen and writing cursive, sometimes during anxiety, sometimes when she feels cold.  No gait or balance difficulties.  No change in smell or taste.  No sleep issues or acting out dreams.  Family history of tremor in her mother but diagnosis was not clear.   REVIEW OF SYSTEMS: Out of a complete 14 system review of symptoms, the patient complains only of the following symptoms, and all other reviewed systems are negative.   ALLERGIES: No Known Allergies   HOME MEDICATIONS: Outpatient Medications Prior to Visit  Medication Sig Dispense Refill   ALPRAZolam  (XANAX ) 0.5 MG tablet for sedation before MRI scan; take 1 tab 1 hour before scan; may  repeat 1 tab 15 min before scan 3 tablet 0   aspirin  EC 81 MG tablet Take 81 mg by mouth in the morning and at bedtime. For 6 weeks after surgery     carbidopa -levodopa  (SINEMET  IR) 25-100 MG tablet Take 2 tablets by mouth 3 (three) times daily. Must keep upcoming apt in November for future refills. 540 tablet 0   celecoxib  (CELEBREX ) 200 MG capsule Take 200 mg by mouth daily as needed for moderate pain (pain score 4-6).     gabapentin  (NEURONTIN ) 300 MG capsule Take 300 mg by mouth 2 (two) times daily.     LORazepam  (ATIVAN ) 0.5 MG tablet Take 0.5 mg by mouth 2 (two) times daily as needed for sleep or anxiety.     methylPREDNISolone  (MEDROL  DOSEPAK) 4 MG TBPK tablet Take by mouth as directed.     olmesartan (BENICAR) 5 MG tablet Take 5 mg by mouth daily.     PSYLLIUM PO Take 2 tablets by mouth daily.     rosuvastatin  (CRESTOR ) 40 MG tablet Take 40 mg by mouth daily.     traMADol  (ULTRAM ) 50 MG tablet Take 50 mg by mouth every 6 (six) hours as needed for moderate pain (pain score 4-6).     No facility-administered medications prior to visit.     PAST MEDICAL HISTORY: Past Medical History:  Diagnosis Date   Anxiety    Breast cancer (HCC) 1995   left breast cancer  HTN (hypertension)    Hyperlipidemia    Hypertension    Insomnia    Parkinson's disease (HCC)    Personal history of chemotherapy 1995   Personal history of radiation therapy 1995   Vitamin D deficiency disease      PAST SURGICAL HISTORY: Past Surgical History:  Procedure Laterality Date   BREAST BIOPSY Left 1995   malignant   BREAST LUMPECTOMY Left 1995   BREAST RECONSTRUCTION  2002   left and breast reduction right breast   COLONOSCOPY  07/12/2017   [facility]   DILATION AND CURETTAGE OF UTERUS  1985   POLYPECTOMY     REDUCTION MAMMAPLASTY Bilateral 2002   REPLACEMENT TOTAL KNEE Right 04/2023     FAMILY HISTORY: Family History  Problem Relation Age of Onset   Hypertension Mother    Colon polyps Father     Dementia Father    Breast cancer Maternal Aunt    Hyperlipidemia Brother    Colon cancer Neg Hx    Stomach cancer Neg Hx    Esophageal cancer Neg Hx    Rectal cancer Neg Hx    BRCA 1/2 Neg Hx      SOCIAL HISTORY: Social History   Socioeconomic History   Marital status: Married    Spouse name: Tanda   Number of children: Not on file   Years of education: Not on file   Highest education level: Not on file  Occupational History   Not on file  Tobacco Use   Smoking status: Former    Current packs/day: 0.00    Types: Cigarettes    Quit date: 05/11/1979    Years since quitting: 45.4   Smokeless tobacco: Never  Vaping Use   Vaping status: Never Used  Substance and Sexual Activity   Alcohol use: Yes    Alcohol/week: 2.0 standard drinks of alcohol    Types: 2 Glasses of wine per week   Drug use: No   Sexual activity: Not on file  Other Topics Concern   Not on file  Social History Narrative   Right handed    Caffeine - Occasional use a few times per month   Lives at home with her husband.    MB< RN 07/12/21   Social Drivers of Health   Financial Resource Strain: Not on file  Food Insecurity: No Food Insecurity (07/10/2024)   Received from [facility]   Hunger Vital Sign    Within the past 12 months, you worried that your food would run out before you got the money to buy more.: Never true    Within the past 12 months, the food you bought just didn't last and you didn't have money to get more.: Never true  Transportation Needs: Not on file  Physical Activity: Not on file  Stress: Not on file  Social Connections: Not on file  Intimate Partner Violence: Not on file     PHYSICAL EXAM  There were no vitals filed for this visit. There is no height or weight on file to calculate BMI.  Generalized: Well developed, in no acute distress  Cardiology: normal rate and rhythm, no murmur auscultated  Respiratory: clear to auscultation bilaterally     Neurological examination  Mentation: Alert oriented to time, place, history taking. Follows all commands speech and language fluent Cranial nerve II-XII: Pupils were equal round reactive to light. Extraocular movements were full, visual field were full on confrontational test. Facial sensation and strength were normal. Uvula tongue midline.  Head turning and shoulder shrug  were normal and symmetric. Motor: The motor testing reveals 5 over 5 strength of all 4 extremities. Good symmetric motor tone is noted throughout.  Sensory: Sensory testing is intact to soft touch on all 4 extremities. No evidence of extinction is noted.  Coordination: Cerebellar testing reveals good finger-nose-finger and heel-to-shin bilaterally.  Gait and station: Gait is normal. Tandem gait is normal. Romberg is negative. No drift is seen.  Reflexes: Deep tendon reflexes are symmetric and normal bilaterally.    DIAGNOSTIC DATA (LABS, IMAGING, TESTING) - I reviewed patient records, labs, notes, testing and imaging myself where available.  Lab Results  Component Value Date   WBC 10.3 08/23/2024   HGB 13.5 08/23/2024   HCT 41.2 08/23/2024   MCV 92.8 08/23/2024   PLT 390 08/23/2024      Component Value Date/Time   NA 135 08/23/2024 2001   K 3.6 08/23/2024 2001   CL 101 08/23/2024 2001   CO2 22 08/23/2024 2001   GLUCOSE 129 (H) 08/23/2024 2001   BUN 19 08/23/2024 2001   CREATININE 0.58 08/23/2024 2001   CALCIUM  9.4 08/23/2024 2001   PROT 7.1 08/23/2024 2001   ALBUMIN 4.0 08/23/2024 2001   AST 18 08/23/2024 2001   ALT <5 08/23/2024 2001   ALKPHOS 68 08/23/2024 2001   BILITOT 0.7 08/23/2024 2001   GFRNONAA >60 08/23/2024 2001   No results found for: CHOL, HDL, LDLCALC, LDLDIRECT, TRIG, CHOLHDL No results found for: YHAJ8R No results found for: VITAMINB12 No results found for: TSH      No data to display               No data to display           ASSESSMENT AND  PLAN  73 y.o. year old female  has a past medical history of Anxiety, Breast cancer (HCC) (1995), HTN (hypertension), Hyperlipidemia, Hypertension, Insomnia, Parkinson's disease (HCC), Personal history of chemotherapy (1995), Personal history of radiation therapy (1995), and Vitamin D deficiency disease. here with    No diagnosis found.  Reena VEAR Dolly ***.  Healthy lifestyle habits encouraged. *** will follow up with PCP as directed. *** will return to see me in ***, sooner if needed. *** verbalizes understanding and agreement with this plan.   No orders of the defined types were placed in this encounter.    No orders of the defined types were placed in this encounter.    Greig Forbes, MSN, FNP-C 10/09/2024, 11:14 AM  [facility] [address], [address] [place], [place] [address] [phone]

## 2024-10-09 NOTE — Patient Instructions (Signed)
 Below is our plan:  We will continue carbidopa  levodopa  two tablets three times daily  Please make sure you are staying well hydrated. I recommend 50-60 ounces daily. Well balanced diet and regular exercise encouraged. Consistent sleep schedule with 6-8 hours recommended.   Please continue follow up with care team as directed.   Follow up in 6-8 months with me or Dr Margaret.   You may receive a survey regarding today's visit. I encourage you to leave honest feed back as I do use this information to improve patient care. Thank you for seeing me today!

## 2024-10-10 ENCOUNTER — Encounter: Payer: Self-pay | Admitting: Family Medicine

## 2024-10-10 ENCOUNTER — Ambulatory Visit: Admitting: Family Medicine

## 2024-10-10 VITALS — BP 112/80 | HR 82 | Resp 17 | Ht 70.0 in

## 2024-10-10 DIAGNOSIS — G20A1 Parkinson's disease without dyskinesia, without mention of fluctuations: Secondary | ICD-10-CM

## 2024-10-10 DIAGNOSIS — R29898 Other symptoms and signs involving the musculoskeletal system: Secondary | ICD-10-CM

## 2024-10-10 DIAGNOSIS — G8929 Other chronic pain: Secondary | ICD-10-CM

## 2024-10-10 DIAGNOSIS — M545 Low back pain, unspecified: Secondary | ICD-10-CM

## 2024-10-10 DIAGNOSIS — Z9889 Other specified postprocedural states: Secondary | ICD-10-CM

## 2024-10-10 MED ORDER — CARBIDOPA-LEVODOPA 25-100 MG PO TABS: 2.0000 | ORAL_TABLET | Freq: Three times a day (TID) | ORAL | 3 refills | Status: AC

## 2024-10-21 DIAGNOSIS — M5416 Radiculopathy, lumbar region: Secondary | ICD-10-CM | POA: Diagnosis not present

## 2024-10-29 DIAGNOSIS — M4125 Other idiopathic scoliosis, thoracolumbar region: Secondary | ICD-10-CM | POA: Diagnosis not present

## 2024-10-29 DIAGNOSIS — Z6828 Body mass index (BMI) 28.0-28.9, adult: Secondary | ICD-10-CM | POA: Diagnosis not present

## 2024-11-04 DIAGNOSIS — M4807 Spinal stenosis, lumbosacral region: Secondary | ICD-10-CM | POA: Diagnosis not present

## 2024-11-04 DIAGNOSIS — R29898 Other symptoms and signs involving the musculoskeletal system: Secondary | ICD-10-CM | POA: Diagnosis not present

## 2024-11-08 ENCOUNTER — Ambulatory Visit: Admitting: Family Medicine

## 2024-11-13 ENCOUNTER — Telehealth: Payer: Self-pay | Admitting: Family Medicine

## 2024-11-13 NOTE — Telephone Encounter (Addendum)
 Have scheduled patient on 11/19/24 at 3:30 pm. Patient stated, for a couple months can walk with the walker, but if walk too long knee will buckle and can not hold self up, goes to the floor. Starting physical therapy on 11/27/24. Have spoke with physical therapist and he advised to get things checked out.

## 2024-11-13 NOTE — Telephone Encounter (Signed)
 Pt is asking to be called to discuss the difficulty she is having now in walking, she would like to be seen , she is on wait list.

## 2024-11-14 NOTE — Telephone Encounter (Signed)
 noted

## 2024-11-18 NOTE — Patient Instructions (Incomplete)

## 2024-11-18 NOTE — Progress Notes (Deleted)
 No chief complaint on file.   HISTORY OF PRESENT ILLNESS:  11/18/24 ALL:  Christine Hayes returns for an acute visit.   10/10/2024 ALL:  Christine Hayes is a 73 y.o. female here today for follow up for PD. She was last seen by Dr Margaret 05/2023 and doing well. She continues carb-levo IR 2 tablets TID.   She had left TKR 07/15/2024. She had delayed recovery and prolonged weakness following. She was seen in the ER 07/2024 requesting inpatient rehab. She was admitted to [facility]. She participated in inpatient therapy then released to [facility]. She has had significant improvement in symptoms. She continues to have some weakness in right knee and reports episodic buckling. She was seen by ortho. MRI lumbar unremarkable. They are planning [facility] 10/27. She feels PD symptoms are stable. She is tolerating car-levo.    HISTORY (copied from Dr Chancy previous note)  UPDATE (06/19/23, VRP): Since last visit, doing well. Symptoms are stable. Some addl fatigue and slow movements, but overall doing well. Some issues with low BP after her right knee surgery in May 2024.    UPDATE (06/14/22, VRP): Since last visit, doing WELL. Symptoms are mild. Tolerating carb/levo 1.5 tabs three times a day now.      UPDATE (10/12/21, VRP): Since last visit, doing well. Symptoms are improved on carb /levo 1 tab three times a day (better movements, less tremor). No on - off issues. Gait stable. No side effects.    PRIOR HPI (07/12/21): 73 year old female here for evaluation of right hand tremor.  Symptoms started in December 2021.  She noted intermittent brief tremor in her right hand and fingers when she is using a pen and writing cursive, sometimes during anxiety, sometimes when she feels cold.  No gait or balance difficulties.  No change in smell or taste.  No sleep issues or acting out dreams.  Family history of tremor in her mother but diagnosis was not clear.   REVIEW OF SYSTEMS: Out of a complete 14 system review of  symptoms, the patient complains only of the following symptoms, right lower ext weakness, back pain, right upper ext tremor, and all other reviewed systems are negative.   ALLERGIES: No Known Allergies   HOME MEDICATIONS: Outpatient Medications Prior to Visit  Medication Sig Dispense Refill   aspirin  EC 81 MG tablet Take 81 mg by mouth in the morning and at bedtime. For 6 weeks after surgery     carbidopa -levodopa  (SINEMET  IR) 25-100 MG tablet Take 2 tablets by mouth 3 (three) times daily. Must keep upcoming apt in November for future refills. 540 tablet 3   LORazepam  (ATIVAN ) 0.5 MG tablet Take 0.5 mg by mouth 2 (two) times daily as needed for sleep or anxiety.     olmesartan (BENICAR) 5 MG tablet Take 5 mg by mouth daily.     PSYLLIUM PO Take 2 tablets by mouth daily.     rosuvastatin  (CRESTOR ) 40 MG tablet Take 40 mg by mouth daily.     No facility-administered medications prior to visit.     PAST MEDICAL HISTORY: Past Medical History:  Diagnosis Date   Anxiety    Breast cancer (HCC) 1995   left breast cancer   HTN (hypertension)    Hyperlipidemia    Hypertension    Insomnia    Parkinson's disease (HCC)    Personal history of chemotherapy 1995   Personal history of radiation therapy 1995   Vitamin D deficiency disease  PAST SURGICAL HISTORY: Past Surgical History:  Procedure Laterality Date   BREAST BIOPSY Left 1995   malignant   BREAST LUMPECTOMY Left 1995   BREAST RECONSTRUCTION  2002   left and breast reduction right breast   COLONOSCOPY  07/12/2017   [facility]   DILATION AND CURETTAGE OF UTERUS  1985   POLYPECTOMY     REDUCTION MAMMAPLASTY Bilateral 2002   REPLACEMENT TOTAL KNEE Right 04/2023     FAMILY HISTORY: Family History  Problem Relation Age of Onset   Hypertension Mother    Colon polyps Father    Dementia Father    Breast cancer Maternal Aunt    Hyperlipidemia Brother    Colon cancer Neg Hx    Stomach cancer Neg Hx    Esophageal cancer  Neg Hx    Rectal cancer Neg Hx    BRCA 1/2 Neg Hx      SOCIAL HISTORY: Social History   Socioeconomic History   Marital status: Married    Spouse name: Tanda   Number of children: Not on file   Years of education: Not on file   Highest education level: Not on file  Occupational History   Not on file  Tobacco Use   Smoking status: Former    Current packs/day: 0.00    Types: Cigarettes    Quit date: 05/11/1979    Years since quitting: 45.5   Smokeless tobacco: Never  Vaping Use   Vaping status: Never Used  Substance and Sexual Activity   Alcohol use: Yes    Alcohol/week: 2.0 standard drinks of alcohol    Types: 2 Glasses of wine per week   Drug use: No   Sexual activity: Not on file  Other Topics Concern   Not on file  Social History Narrative   Right handed    Caffeine - Occasional use a few times per month   Lives at home with her husband.    MB< RN 07/12/21   Social Drivers of Health   Financial Resource Strain: Not on file  Food Insecurity: No Food Insecurity (07/10/2024)   Received from [facility]   Hunger Vital Sign    Within the past 12 months, you worried that your food would run out before you got the money to buy more.: Never true    Within the past 12 months, the food you bought just didn't last and you didn't have money to get more.: Never true  Transportation Needs: Not on file  Physical Activity: Not on file  Stress: Not on file  Social Connections: Not on file  Intimate Partner Violence: Not on file     PHYSICAL EXAM  There were no vitals filed for this visit.  There is no height or weight on file to calculate BMI.  Generalized: Well developed, in no acute distress  Cardiology: normal rate and rhythm, no murmur auscultated  Respiratory: clear to auscultation bilaterally    Neurological examination  Mentation: Alert oriented to time, place, history taking. Follows all commands speech and language fluent Cranial nerve  II-XII: Pupils were equal round reactive to light. Extraocular movements were full, visual field were full on confrontational test. Facial sensation and strength were normal. Uvula tongue midline. Head turning and shoulder shrug  were normal and symmetric. Motor: The motor testing reveals 5 over 5 strength of all 4 extremities. Good symmetric motor tone is noted throughout.  Sensory: Sensory testing is intact to soft touch on all 4 extremities. No evidence of  extinction is noted.  Coordination: Cerebellar testing reveals good finger-nose-finger and heel-to-shin bilaterally.  Gait and station: Gait not assessed, in wheelchair due to right knee pain/buckling  Reflexes: Deep tendon reflexes are symmetric and normal bilaterally.    DIAGNOSTIC DATA (LABS, IMAGING, TESTING) - I reviewed patient records, labs, notes, testing and imaging myself where available.  Lab Results  Component Value Date   WBC 10.3 08/23/2024   HGB 13.5 08/23/2024   HCT 41.2 08/23/2024   MCV 92.8 08/23/2024   PLT 390 08/23/2024      Component Value Date/Time   NA 135 08/23/2024 2001   K 3.6 08/23/2024 2001   CL 101 08/23/2024 2001   CO2 22 08/23/2024 2001   GLUCOSE 129 (H) 08/23/2024 2001   BUN 19 08/23/2024 2001   CREATININE 0.58 08/23/2024 2001   CALCIUM  9.4 08/23/2024 2001   PROT 7.1 08/23/2024 2001   ALBUMIN 4.0 08/23/2024 2001   AST 18 08/23/2024 2001   ALT <5 08/23/2024 2001   ALKPHOS 68 08/23/2024 2001   BILITOT 0.7 08/23/2024 2001   GFRNONAA >60 08/23/2024 2001   No results found for: CHOL, HDL, LDLCALC, LDLDIRECT, TRIG, CHOLHDL No results found for: YHAJ8R No results found for: VITAMINB12 No results found for: TSH      No data to display               No data to display           ASSESSMENT AND PLAN  73 y.o. year old female  has a past medical history of Anxiety, Breast cancer (HCC) (1995), HTN (hypertension), Hyperlipidemia, Hypertension, Insomnia, Parkinson's  disease (HCC), Personal history of chemotherapy (1995), Personal history of radiation therapy (1995), and Vitamin D deficiency disease. here with    No diagnosis found.  Christine Hayes is doing well from a PD standpoint. We will continue carb-levo 2 tablets TID. I have encouraged close follow up with orthopedics for right knee and low back pain. Continue PT. Healthy lifestyle habits encouraged. She will follow up with PCP as directed. She will return to see me or Dr Margaret in 6-12 months, sooner if needed. She verbalizes understanding and agreement with this plan.   No orders of the defined types were placed in this encounter.    No orders of the defined types were placed in this encounter.    Greig Forbes, MSN, FNP-C 11/18/2024, 11:06 AM  [facility] [address], [address] [place], [place] [address] [phone]

## 2024-11-19 ENCOUNTER — Ambulatory Visit: Admitting: Family Medicine

## 2025-01-02 ENCOUNTER — Telehealth: Payer: Self-pay | Admitting: Family Medicine

## 2025-01-02 NOTE — Telephone Encounter (Signed)
 Pt reports that last night she was unable to sleep at all as a result of both legs burning and tingling, she would like to know if Amy, NP will call in something for her, please call.

## 2025-01-02 NOTE — Telephone Encounter (Signed)
 I called pt.  She said that she has had issues with burning/numbness below her knees since her bilateral knee surgeries.  Dr. Alusio had given her gabapentin  , celebrex  at one time.  I told her that we have not addressed this with her previously.  She will need to have referral from Dr. Hiram or pcp, her pcp is now out on medical leave.   MD would address new problems, then Amy NP could see her after that.  That is our process.  She appreciated call back and will see how she does the next few days.

## 2025-06-16 ENCOUNTER — Ambulatory Visit: Admitting: Family Medicine
